# Patient Record
Sex: Female | Born: 1985 | Race: Black or African American | Hispanic: No | Marital: Married | Smoking: Never smoker
Health system: Southern US, Community
[De-identification: ages and names within clinical notes are randomized; demographics above are authoritative.]

## PROBLEM LIST (undated history)

## (undated) ENCOUNTER — Inpatient Hospital Stay (HOSPITAL_COMMUNITY): Payer: Self-pay

## (undated) DIAGNOSIS — F418 Other specified anxiety disorders: Secondary | ICD-10-CM

## (undated) DIAGNOSIS — N390 Urinary tract infection, site not specified: Secondary | ICD-10-CM

## (undated) DIAGNOSIS — F419 Anxiety disorder, unspecified: Secondary | ICD-10-CM

## (undated) DIAGNOSIS — K219 Gastro-esophageal reflux disease without esophagitis: Secondary | ICD-10-CM

## (undated) DIAGNOSIS — O2620 Pregnancy care for patient with recurrent pregnancy loss, unspecified trimester: Secondary | ICD-10-CM

## (undated) DIAGNOSIS — I10 Essential (primary) hypertension: Secondary | ICD-10-CM

## (undated) DIAGNOSIS — A749 Chlamydial infection, unspecified: Secondary | ICD-10-CM

## (undated) HISTORY — DX: Pregnancy care for patient with recurrent pregnancy loss, unspecified trimester: O26.20

## (undated) HISTORY — PX: FOOT SURGERY: SHX648

---

## 2011-10-20 ENCOUNTER — Emergency Department (HOSPITAL_BASED_OUTPATIENT_CLINIC_OR_DEPARTMENT_OTHER)
Admission: EM | Admit: 2011-10-20 | Discharge: 2011-10-20 | Disposition: A | Discharge status: Home / Self Care | Payer: Self-pay | Attending: Emergency Medicine | Admitting: Emergency Medicine

## 2011-10-20 ENCOUNTER — Encounter (HOSPITAL_BASED_OUTPATIENT_CLINIC_OR_DEPARTMENT_OTHER): Payer: Self-pay | Admitting: *Deleted

## 2011-10-20 DIAGNOSIS — N760 Acute vaginitis: Secondary | ICD-10-CM | POA: Insufficient documentation

## 2011-10-20 DIAGNOSIS — A499 Bacterial infection, unspecified: Secondary | ICD-10-CM | POA: Insufficient documentation

## 2011-10-20 DIAGNOSIS — B9689 Other specified bacterial agents as the cause of diseases classified elsewhere: Secondary | ICD-10-CM | POA: Insufficient documentation

## 2011-10-20 DIAGNOSIS — N390 Urinary tract infection, site not specified: Secondary | ICD-10-CM | POA: Insufficient documentation

## 2011-10-20 LAB — URINALYSIS, ROUTINE W REFLEX MICROSCOPIC
Glucose, UA: NEGATIVE mg/dL
Specific Gravity, Urine: 1.028 (ref 1.005–1.030)

## 2011-10-20 LAB — PREGNANCY, URINE: Preg Test, Ur: NEGATIVE

## 2011-10-20 LAB — WET PREP, GENITAL: Trich, Wet Prep: NONE SEEN

## 2011-10-20 LAB — URINE MICROSCOPIC-ADD ON

## 2011-10-20 MED ORDER — METRONIDAZOLE 500 MG PO TABS: 500.0000 mg | ORAL_TABLET | Freq: Two times a day (BID) | ORAL | Status: DC

## 2011-10-20 MED ORDER — NITROFURANTOIN MONOHYD MACRO 100 MG PO CAPS: 100.0000 mg | ORAL_CAPSULE | Freq: Two times a day (BID) | ORAL | Status: AC

## 2011-10-20 MED ORDER — METRONIDAZOLE 500 MG PO TABS: 500.0000 mg | ORAL_TABLET | Freq: Two times a day (BID) | ORAL | Status: AC

## 2011-10-20 NOTE — ED Notes (Signed)
Pt c/o intermittent lower abd pain x2 weeks. Pt sts same is worse today. Pt denies difficulty urinating, odor or discharge.

## 2011-10-20 NOTE — ED Provider Notes (Signed)
History     CSN: [id number]  Arrival date & time 10/20/11  2008   First MD Initiated Contact with Patient 10/20/11 2024      Chief Complaint  Patient presents with  . Abdominal Pain    (Consider location/radiation/quality/duration/timing/severity/associated sxs/prior treatment) HPI Comments: Pt states that she had some dysuria when the symptoms started but that resolved  Patient is a 26 y.o. female presenting with abdominal pain. The history is provided by the patient. No language interpreter was used.  Abdominal Pain The primary symptoms of the illness include abdominal pain and dysuria. The primary symptoms of the illness do not include fever, nausea, vomiting, diarrhea or vaginal discharge. Episode onset: 2 weeks. The onset of the illness was gradual. The problem has not changed since onset.   Past Medical History  Diagnosis Date  . Asthma     Past Surgical History  Procedure Date  . Cesarean section     No family history on file.  History  Substance Use Topics  . Smoking status: Never Smoker   . Smokeless tobacco: Not on file  . Alcohol Use: Yes    OB History    Grav Para Term Preterm Abortions TAB SAB Ect Mult Living                  Review of Systems  Constitutional: Negative for fever.  Respiratory: Negative.   Cardiovascular: Negative.   Gastrointestinal: Positive for abdominal pain. Negative for nausea, vomiting and diarrhea.  Genitourinary: Positive for dysuria. Negative for vaginal discharge.  Neurological: Negative.     Allergies  Review of patient's allergies indicates no known allergies.  Home Medications   Current Outpatient Rx  Name Route Sig Dispense Refill  . IBUPROFEN 200 MG PO TABS Oral Take 200 mg by mouth every 6 (six) hours as needed. Patient used this medication for her headache.      BP 151/78  Pulse 87  Temp(Src) 98.1 F (36.7 C) (Oral)  Resp 18  SpO2 100%  LMP 10/09/2011  Physical Exam  Nursing note and vitals  reviewed. Constitutional: She is oriented to person, place, and time. She appears well-developed.  HENT:  Head: Normocephalic and atraumatic.  Eyes: Conjunctivae and EOM are normal.  Neck: Normal range of motion. Neck supple.  Cardiovascular: Normal rate and regular rhythm.   Pulmonary/Chest: Effort normal and breath sounds normal.  Abdominal: There is tenderness in the suprapubic area. There is no CVA tenderness.  Musculoskeletal: Normal range of motion.  Neurological: She is alert and oriented to person, place, and time.    ED Course  Procedures (including critical care time)  Labs Reviewed  URINALYSIS, ROUTINE W REFLEX MICROSCOPIC - Abnormal; Notable for the following:    APPearance CLOUDY (*)    Hgb urine dipstick LARGE (*)    Protein, ur 100 (*)    Leukocytes, UA MODERATE (*)    All other components within normal limits  URINE MICROSCOPIC-ADD ON - Abnormal; Notable for the following:    Squamous Epithelial / LPF FEW (*)    Bacteria, UA FEW (*)    All other components within normal limits  WET PREP, GENITAL - Abnormal; Notable for the following:    Clue Cells Wet Prep HPF POC MANY (*)    WBC, Wet Prep HPF POC FEW (*)    All other components within normal limits  PREGNANCY, URINE  GC/CHLAMYDIA PROBE AMP, GENITAL   No results found.   1. UTI (lower urinary tract infection)  2. BV (bacterial vaginosis)       MDM  Will treat for simple uti and bv:pt is okay to follow up as needed        Teressa Lower, NP 10/20/11 2136

## 2011-10-20 NOTE — Discharge Instructions (Signed)
Bacterial Vaginosis Bacterial vaginosis (BV) is a vaginal infection where the normal balance of bacteria in the vagina is disrupted. The normal balance is then replaced by an overgrowth of certain bacteria. There are several different kinds of bacteria that can cause BV. BV is the most common vaginal infection in women of childbearing age. CAUSES   The cause of BV is not fully understood. BV develops when there is an increase or imbalance of harmful bacteria.   Some activities or behaviors can upset the normal balance of bacteria in the vagina and put women at increased risk including:   Having a new sex partner or multiple sex partners.   Douching.   Using an intrauterine device (IUD) for contraception.   It is not clear what role sexual activity plays in the development of BV. However, women that have never had sexual intercourse are rarely infected with BV.  Women do not get BV from toilet seats, bedding, swimming pools or from touching objects around them.  SYMPTOMS   Grey vaginal discharge.   A fish-like odor with discharge, especially after sexual intercourse.   Itching or burning of the vagina and vulva.   Burning or pain with urination.   Some women have no signs or symptoms at all.  DIAGNOSIS  Your caregiver must examine the vagina for signs of BV. Your caregiver will perform lab tests and look at the sample of vaginal fluid through a microscope. They will look for bacteria and abnormal cells (clue cells), a pH test higher than 4.5, and a positive amine test all associated with BV.  RISKS AND COMPLICATIONS   Pelvic inflammatory disease (PID).   Infections following gynecology surgery.   Developing HIV.   Developing herpes virus.  TREATMENT  Sometimes BV will clear up without treatment. However, all women with symptoms of BV should be treated to avoid complications, especially if gynecology surgery is planned. Female partners generally do not need to be treated. However,  BV may spread between female sex partners so treatment is helpful in preventing a recurrence of BV.   BV may be treated with antibiotics. The antibiotics come in either pill or vaginal cream forms. Either can be used with nonpregnant or pregnant women, but the recommended dosages differ. These antibiotics are not harmful to the baby.   BV can recur after treatment. If this happens, a second round of antibiotics will often be prescribed.   Treatment is important for pregnant women. If not treated, BV can cause a premature delivery, especially for a pregnant woman who had a premature birth in the past. All pregnant women who have symptoms of BV should be checked and treated.   For chronic reoccurrence of BV, treatment with a type of prescribed gel vaginally twice a week is helpful.  HOME CARE INSTRUCTIONS   Finish all medication as directed by your caregiver.   Do not have sex until treatment is completed.   Tell your sexual partner that you have a vaginal infection. They should see their caregiver and be treated if they have problems, such as a mild rash or itching.   Practice safe sex. Use condoms. Only have 1 sex partner.  PREVENTION  Basic prevention steps can help reduce the risk of upsetting the natural balance of bacteria in the vagina and developing BV:  Do not have sexual intercourse (be abstinent).   Do not douche.   Use all of the medicine prescribed for treatment of BV, even if the signs and symptoms go away.     Tell your sex partner if you have BV. That way, they can be treated, if needed, to prevent reoccurrence.  SEEK MEDICAL CARE IF:   Your symptoms are not improving after 3 days of treatment.   You have increased discharge, pain, or fever.  MAKE SURE YOU:   Understand these instructions.   Will watch your condition.   Will get help right away if you are not doing well or get worse.  FOR MORE INFORMATION  Division of STD Prevention (DSTDP), Centers for Disease  Control and Prevention: www.cdc.gov/std American Social Health Association (ASHA): www.ashastd.org  Document Released: 07/03/2005 Document Revised: 06/22/2011 Document Reviewed: 12/24/2008 ExitCare Patient Information 2012 [employer].Urinary Tract Infection Infections of the urinary tract can start in several places. A bladder infection (cystitis), a kidney infection (pyelonephritis), and a prostate infection (prostatitis) are different types of urinary tract infections (UTIs). They usually get better if treated with medicines (antibiotics) that kill germs. Take all the medicine until it is gone. You or your child may feel better in a few days, but TAKE ALL MEDICINE or the infection may not respond and may become more difficult to treat. HOME CARE INSTRUCTIONS   Drink enough water and fluids to keep the urine clear or pale yellow. Cranberry juice is especially recommended, in addition to large amounts of water.   Avoid caffeine, tea, and carbonated beverages. They tend to irritate the bladder.   Alcohol may irritate the prostate.   Only take over-the-counter or prescription medicines for pain, discomfort, or fever as directed by your caregiver.  To prevent further infections:  Empty the bladder often. Avoid holding urine for long periods of time.   After a bowel movement, women should cleanse from front to back. Use each tissue only once.   Empty the bladder before and after sexual intercourse.  FINDING OUT THE RESULTS OF YOUR TEST Not all test results are available during your visit. If your or your child's test results are not back during the visit, make an appointment with your caregiver to find out the results. Do not assume everything is normal if you have not heard from your caregiver or the medical facility. It is important for you to follow up on all test results. SEEK MEDICAL CARE IF:   There is back pain.   Your baby is older than 3 months with a rectal temperature of 100.5  F (38.1 C) or higher for more than 1 day.   Your or your child's problems (symptoms) are no better in 3 days. Return sooner if you or your child is getting worse.  SEEK IMMEDIATE MEDICAL CARE IF:   There is severe back pain or lower abdominal pain.   You or your child develops chills.   You have a fever.   Your baby is older than 3 months with a rectal temperature of 102 F (38.9 C) or higher.   Your baby is 3 months old or younger with a rectal temperature of 100.4 F (38 C) or higher.   There is nausea or vomiting.   There is continued burning or discomfort with urination.  MAKE SURE YOU:   Understand these instructions.   Will watch your condition.   Will get help right away if you are not doing well or get worse.  Document Released: 04/12/2005 Document Revised: 06/22/2011 Document Reviewed: 11/15/2006 ExitCare Patient Information 2012 [employer]. 

## 2011-10-21 NOTE — ED Provider Notes (Signed)
Medical screening examination/treatment/procedure(s) were performed by non-physician practitioner and as supervising physician I was immediately available for consultation/collaboration.   Glynn Octave, MD 10/21/11 [phone]

## 2013-10-10 ENCOUNTER — Encounter (HOSPITAL_BASED_OUTPATIENT_CLINIC_OR_DEPARTMENT_OTHER): Payer: Self-pay | Admitting: Emergency Medicine

## 2013-10-10 ENCOUNTER — Emergency Department (HOSPITAL_BASED_OUTPATIENT_CLINIC_OR_DEPARTMENT_OTHER)
Admission: EM | Admit: 2013-10-10 | Discharge: 2013-10-10 | Disposition: A | Discharge status: Home / Self Care | Payer: Self-pay | Attending: Emergency Medicine | Admitting: Emergency Medicine

## 2013-10-10 DIAGNOSIS — J45909 Unspecified asthma, uncomplicated: Secondary | ICD-10-CM | POA: Insufficient documentation

## 2013-10-10 DIAGNOSIS — H6690 Otitis media, unspecified, unspecified ear: Secondary | ICD-10-CM | POA: Insufficient documentation

## 2013-10-10 DIAGNOSIS — J029 Acute pharyngitis, unspecified: Secondary | ICD-10-CM | POA: Insufficient documentation

## 2013-10-10 DIAGNOSIS — H6691 Otitis media, unspecified, right ear: Secondary | ICD-10-CM

## 2013-10-10 MED ORDER — IBUPROFEN 800 MG PO TABS: 800.0000 mg | ORAL_TABLET | Freq: Three times a day (TID) | ORAL | Status: DC

## 2013-10-10 MED ORDER — AMOXICILLIN 500 MG PO CAPS: 500.0000 mg | ORAL_CAPSULE | Freq: Three times a day (TID) | ORAL | Status: DC

## 2013-10-10 NOTE — ED Provider Notes (Signed)
CSN: [id number]     Arrival date & time 10/10/13  1827 History   First MD Initiated Contact with Patient 10/10/13 1834     Chief Complaint  Patient presents with  . URI     (Consider location/radiation/quality/duration/timing/severity/associated sxs/prior Treatment) Patient is a 28 y.o. female presenting with URI. The history is provided by the patient.  URI Presenting symptoms: congestion, ear pain, rhinorrhea and sore throat   Presenting symptoms: no cough and no fever   Severity:  Moderate Onset quality:  Gradual Duration:  24 hours Timing:  Constant Progression:  Worsening Chronicity:  New Relieved by:  Nothing Worsened by:  Nothing tried Ineffective treatments: tylenol. Associated symptoms: headaches   Associated symptoms: no neck pain and no wheezing    Lorella NimrodLarhonda Centry is a 28 y.o. female who presents to the ED with nasal congestion and ear pain that started last night.  Past Medical History  Diagnosis Date  . Asthma    Past Surgical History  Procedure Laterality Date  . Cesarean section     No family history on file. History  Substance Use Topics  . Smoking status: Never Smoker   . Smokeless tobacco: Not on file  . Alcohol Use: No   OB History   Grav Para Term Preterm Abortions TAB SAB Ect Mult Living                 Review of Systems  Constitutional: Positive for chills. Negative for fever.  HENT: Positive for congestion, ear pain, rhinorrhea and sore throat.   Eyes: Negative for pain, redness and visual disturbance.  Respiratory: Negative for cough and wheezing.   Cardiovascular: Negative for chest pain and palpitations.  Gastrointestinal: Negative for nausea, vomiting and abdominal pain.  Genitourinary: Negative for dysuria, urgency and frequency.  Musculoskeletal: Negative for neck pain.  Skin: Negative for rash.  Neurological: Positive for headaches. Negative for dizziness and syncope.  Psychiatric/Behavioral: Negative for confusion. The patient  is not nervous/anxious.       Allergies  Review of patient's allergies indicates no known allergies.  Home Medications   Current Outpatient Rx  Name  Route  Sig  Dispense  Refill  . ibuprofen (ADVIL,MOTRIN) 200 MG tablet   Oral   Take 200 mg by mouth every 6 (six) hours as needed. Patient used this medication for her headache.          BP 154/87  Pulse 96  Temp(Src) 99.6 F (37.6 C) (Oral)  Resp 20  Ht 5\' 3"  (1.6 m)  Wt 220 lb (99.791 kg)  BMI 38.98 kg/m2  SpO2 100%  LMP 08/12/2013 Physical Exam  Nursing note and vitals reviewed. Constitutional: She is oriented to person, place, and time. She appears well-developed and well-nourished. No distress.  HENT:  Head: Normocephalic.  Right Ear: Tympanic membrane is erythematous and retracted.  Left Ear: Tympanic membrane is erythematous.  Nose: Mucosal edema and rhinorrhea present.  Mouth/Throat: Uvula is midline and mucous membranes are normal. Posterior oropharyngeal erythema present.  Eyes: EOM are normal.  Neck: Neck supple.  Cardiovascular: Normal rate and regular rhythm.   Pulmonary/Chest: Effort normal. She has no wheezes. She has no rales.  Abdominal: Soft. There is no tenderness.  Musculoskeletal: Normal range of motion.  Lymphadenopathy:    She has cervical adenopathy.  Neurological: She is alert and oriented to person, place, and time. No cranial nerve deficit.  Skin: Skin is warm and dry.  Psychiatric: She has a normal mood and affect. Her behavior  is normal.    ED Course  Procedures  MDM  28 y.o. female with ear pain, sore throat and low grade fever x 24 hours. Will treat otitis with antibiotics and give ibuprofen for pain. She is to follow up with her doctor in one week to be sure the infection is improving. She will return here for worsening symptoms. Stable for discharge without further screening at this time. Discussed with the patient and all questioned fully answered.   Medication List    TAKE  these medications       amoxicillin 500 MG capsule  Commonly known as:  AMOXIL  Take 1 capsule (500 mg total) by mouth 3 (three) times daily.      ASK your doctor about these medications       ibuprofen 200 MG tablet  Commonly known as:  ADVIL,MOTRIN  Take 200 mg by mouth every 6 (six) hours as needed. Patient used this medication for her headache.  Ask about: Which instructions should I use?     ibuprofen 800 MG tablet  Commonly known as:  ADVIL,MOTRIN  Take 1 tablet (800 mg total) by mouth 3 (three) times daily.  Ask about: Which instructions should I use?         [facility] Orlene Och, [place] 10/10/13 Ernestina [place]

## 2013-10-10 NOTE — ED Provider Notes (Signed)
Medical screening examination/treatment/procedure(s) were performed by non-physician practitioner and as supervising physician I was immediately available for consultation/collaboration.     Fancy Dunkley, MD 10/10/13 2318 

## 2013-10-10 NOTE — ED Notes (Signed)
Ear pain, headache, sore throat since last night. She took Tylenol with relief last night.

## 2013-10-10 NOTE — Discharge Instructions (Signed)
Follow up with your doctor in a week to be sure the ear infection resolves. Return here for worsening symptoms.

## 2015-04-15 ENCOUNTER — Encounter (HOSPITAL_BASED_OUTPATIENT_CLINIC_OR_DEPARTMENT_OTHER): Payer: Self-pay | Admitting: *Deleted

## 2015-04-15 ENCOUNTER — Emergency Department (HOSPITAL_BASED_OUTPATIENT_CLINIC_OR_DEPARTMENT_OTHER): Payer: Self-pay

## 2015-04-15 ENCOUNTER — Emergency Department (HOSPITAL_BASED_OUTPATIENT_CLINIC_OR_DEPARTMENT_OTHER)
Admission: EM | Admit: 2015-04-15 | Discharge: 2015-04-15 | Disposition: A | Discharge status: Home / Self Care | Payer: Self-pay | Attending: Emergency Medicine | Admitting: Emergency Medicine

## 2015-04-15 DIAGNOSIS — Z3A01 Less than 8 weeks gestation of pregnancy: Secondary | ICD-10-CM | POA: Insufficient documentation

## 2015-04-15 DIAGNOSIS — O99341 Other mental disorders complicating pregnancy, first trimester: Secondary | ICD-10-CM | POA: Insufficient documentation

## 2015-04-15 DIAGNOSIS — Z79899 Other long term (current) drug therapy: Secondary | ICD-10-CM | POA: Insufficient documentation

## 2015-04-15 DIAGNOSIS — Z792 Long term (current) use of antibiotics: Secondary | ICD-10-CM | POA: Insufficient documentation

## 2015-04-15 DIAGNOSIS — J45909 Unspecified asthma, uncomplicated: Secondary | ICD-10-CM | POA: Insufficient documentation

## 2015-04-15 DIAGNOSIS — F418 Other specified anxiety disorders: Secondary | ICD-10-CM | POA: Insufficient documentation

## 2015-04-15 DIAGNOSIS — O99511 Diseases of the respiratory system complicating pregnancy, first trimester: Secondary | ICD-10-CM | POA: Insufficient documentation

## 2015-04-15 DIAGNOSIS — F1210 Cannabis abuse, uncomplicated: Secondary | ICD-10-CM | POA: Insufficient documentation

## 2015-04-15 DIAGNOSIS — Z791 Long term (current) use of non-steroidal anti-inflammatories (NSAID): Secondary | ICD-10-CM | POA: Insufficient documentation

## 2015-04-15 DIAGNOSIS — O200 Threatened abortion: Secondary | ICD-10-CM | POA: Insufficient documentation

## 2015-04-15 HISTORY — DX: Other specified anxiety disorders: F41.8

## 2015-04-15 LAB — CBC WITH DIFFERENTIAL/PLATELET
BASOS ABS: 0 10*3/uL (ref 0.0–0.1)
Basophils Relative: 1 %
Eosinophils Absolute: 0.2 10*3/uL (ref 0.0–0.7)
Eosinophils Relative: 3 %
HEMATOCRIT: 39.7 % (ref 36.0–46.0)
Hemoglobin: 13.8 g/dL (ref 12.0–15.0)
LYMPHS PCT: 34 %
Lymphs Abs: 1.8 10*3/uL (ref 0.7–4.0)
MCH: 29.8 pg (ref 26.0–34.0)
MCHC: 34.8 g/dL (ref 30.0–36.0)
MCV: 85.7 fL (ref 78.0–100.0)
Monocytes Absolute: 0.4 10*3/uL (ref 0.1–1.0)
Monocytes Relative: 8 %
NEUTROS ABS: 2.9 10*3/uL (ref 1.7–7.7)
NEUTROS PCT: 54 %
PLATELETS: 263 10*3/uL (ref 150–400)
RBC: 4.63 MIL/uL (ref 3.87–5.11)
RDW: 12.3 % (ref 11.5–15.5)
WBC: 5.3 10*3/uL (ref 4.0–10.5)

## 2015-04-15 LAB — BASIC METABOLIC PANEL
BUN: 12 mg/dL (ref 6–20)
CO2: 25 mmol/L (ref 22–32)
Calcium: 8.6 mg/dL — ABNORMAL LOW (ref 8.9–10.3)
Chloride: 110 mmol/L (ref 101–111)
Creatinine, Ser: 0.66 mg/dL (ref 0.44–1.00)
GFR calc Af Amer: >60 mL/min (ref >60)
Glucose, Bld: 97 mg/dL (ref 65–99)
POTASSIUM: 3.9 mmol/L (ref 3.5–5.1)
SODIUM: 137 mmol/L (ref 135–145)

## 2015-04-15 LAB — RAPID URINE DRUG SCREEN, HOSP PERFORMED
Amphetamines: NOT DETECTED
BARBITURATES: NOT DETECTED
Benzodiazepines: NOT DETECTED
COCAINE: NOT DETECTED
Opiates: NOT DETECTED
Tetrahydrocannabinol: POSITIVE — AB

## 2015-04-15 LAB — PREGNANCY, URINE: Preg Test, Ur: POSITIVE — AB

## 2015-04-15 LAB — HCG, QUANTITATIVE, PREGNANCY: hCG, Beta Chain, Quant, S: 139 m[IU]/mL — ABNORMAL HIGH (ref <5)

## 2015-04-15 NOTE — ED Notes (Signed)
Patient transported to Ultrasound 

## 2015-04-15 NOTE — ED Provider Notes (Signed)
CSN: [id number]     Arrival date & time 04/15/15  0900 History   First MD Initiated Contact with Patient 04/15/15 [phone]     Chief Complaint  Patient presents with  . Vaginal Bleeding     (Consider location/radiation/quality/duration/timing/severity/associated sxs/prior Treatment) Patient is a 29 y.o. female presenting with vaginal bleeding. The history is provided by the patient.  Vaginal Bleeding Associated symptoms: abdominal pain   Associated symptoms: no back pain, no dysuria, no fever, no nausea and no vaginal discharge    patient presents with the mild abdominal cramping and vaginal bleeding that is similar to menstrual flow. Patient had a positive pregnancy test earlier this week. Had a negative pregnancy test on September 14. Patient's last menstrual period was August 16 and they've been regular. Patient gravida 3 para 2. Patient started with vaginal bleeding yesterday no passage of clots. No feeling like she'll pass out or lightheadedness. No significant abdominal pain.  Past Medical History  Diagnosis Date  . Asthma   . Depression with anxiety    Past Surgical History  Procedure Laterality Date  . Cesarean section      x 2   No family history on file. Social History  Substance Use Topics  . Smoking status: Never Smoker   . Smokeless tobacco: None  . Alcohol Use: No   OB History    Gravida Para Term Preterm AB TAB SAB Ectopic Multiple Living   1              Review of Systems  Constitutional: Negative for fever.  HENT: Negative for congestion.   Eyes: Negative for visual disturbance.  Respiratory: Negative for shortness of breath.   Cardiovascular: Negative for chest pain.  Gastrointestinal: Positive for abdominal pain. Negative for nausea and vomiting.  Genitourinary: Positive for vaginal bleeding. Negative for dysuria and vaginal discharge.  Musculoskeletal: Negative for back pain.  Neurological: Negative for syncope, light-headedness and headaches.   Hematological: Does not bruise/bleed easily.  Psychiatric/Behavioral: Negative for confusion.      Allergies  Review of patient's allergies indicates no known allergies.  Home Medications   Prior to Admission medications   Medication Sig Start Date End Date Taking? Authorizing Provider  sertraline (ZOLOFT) 100 MG tablet Take 100 mg by mouth daily.   Yes Historical Provider, MD  amoxicillin (AMOXIL) 500 MG capsule Take 1 capsule (500 mg total) by mouth 3 (three) times daily. 10/10/13   Hope Orlene Och, NP  ibuprofen (ADVIL,MOTRIN) 200 MG tablet Take 200 mg by mouth every 6 (six) hours as needed. Patient used this medication for her headache.    Historical Provider, MD  ibuprofen (ADVIL,MOTRIN) 800 MG tablet Take 1 tablet (800 mg total) by mouth 3 (three) times daily. 10/10/13   Hope Orlene Och, NP   BP 122/79 mmHg  Temp(Src) 98.8 F (37.1 C) (Oral)  Resp 18  Ht  (1.575 m)  Wt 203 lb (92.08 kg)  BMI 37.12 kg/m2  SpO2 100%  LMP 03/02/2015 (Exact Date) Physical Exam  Constitutional: She is oriented to person, place, and time. She appears well-developed and well-nourished.  HENT:  Head: Normocephalic and atraumatic.  Mouth/Throat: Oropharynx is clear and moist.  Eyes: Conjunctivae and EOM are normal. Pupils are equal, round, and reactive to light.  Neck: Normal range of motion. Neck supple.  Cardiovascular: Normal rate, regular rhythm and normal heart sounds.   No murmur heard. Pulmonary/Chest: Effort normal and breath sounds normal. No respiratory distress.  Abdominal: Soft. Bowel sounds  are normal. There is no tenderness.  Musculoskeletal: Normal range of motion.  Neurological: She is alert and oriented to person, place, and time. No cranial nerve deficit. She exhibits normal muscle tone. Coordination normal.  Skin: Skin is warm. No rash noted.  Nursing note and vitals reviewed.   ED Course  Procedures (including critical care time) Labs Review Labs Reviewed  PREGNANCY,  URINE - Abnormal; Notable for the following:    Preg Test, Ur POSITIVE (*)    All other components within normal limits  URINE RAPID DRUG SCREEN, HOSP PERFORMED - Abnormal; Notable for the following:    Tetrahydrocannabinol POSITIVE (*)    All other components within normal limits  BASIC METABOLIC PANEL - Abnormal; Notable for the following:    Calcium 8.6 (*)    Anion gap <3 (*)    All other components within normal limits  HCG, QUANTITATIVE, PREGNANCY - Abnormal; Notable for the following:    hCG, Beta Chain, Quant, S 139 (*)    All other components within normal limits  CBC WITH DIFFERENTIAL/PLATELET    Imaging Review US Ob Comp Less 14 Wks  04/15/2015   CLINICAL DATA:  C/o vaginal bleeding and pelvic cramping that started yesterday and got heavier yesterday evening; hx 2 prior c-sections, LEEP  EXAM: OBSTETRIC <14 WK [place] AND TRANSVAGINAL OB US  TECHNIQUE: Both transabdominal and transvaginal ultrasound examinations were performed for complete evaluation of the gestation as well as the maternal uterus, adnexal regions, and pelvic cul-de-sac. Transvaginal technique was performed to assess early pregnancy.  COMPARISON:  None.  FINDINGS: Intrauterine gestational sac: None.  Maternal uterus/adnexae: The RIGHT ovary appears normal. No uterine mass. Myometrial echotexture is normal.  LEFT ovary demonstrates a 14 mm x 10 mm x 9 mm heterogeneous structure within the central ovary, most compatible with corpus luteum cyst.  Trace free fluid is present.  IMPRESSION: No intrauterine pregnancy identified in this patient with a quantitative beta HCG of 139. Probable corpus luteum cyst in the LEFT ovary.   Electronically Signed   By: Andreas Newport M.D.   On: 04/15/2015 11:07   US Ob Transvaginal  04/15/2015   CLINICAL DATA:  C/o vaginal bleeding and pelvic cramping that started yesterday and got heavier yesterday evening; hx 2 prior c-sections, LEEP  EXAM: OBSTETRIC <14 WK [place] AND TRANSVAGINAL OB US   TECHNIQUE: Both transabdominal and transvaginal ultrasound examinations were performed for complete evaluation of the gestation as well as the maternal uterus, adnexal regions, and pelvic cul-de-sac. Transvaginal technique was performed to assess early pregnancy.  COMPARISON:  None.  FINDINGS: Intrauterine gestational sac: None.  Maternal uterus/adnexae: The RIGHT ovary appears normal. No uterine mass. Myometrial echotexture is normal.  LEFT ovary demonstrates a 14 mm x 10 mm x 9 mm heterogeneous structure within the central ovary, most compatible with corpus luteum cyst.  Trace free fluid is present.  IMPRESSION: No intrauterine pregnancy identified in this patient with a quantitative beta HCG of 139. Probable corpus luteum cyst in the LEFT ovary.   Electronically Signed   By: Andreas Newport M.D.   On: 04/15/2015 11:07   I have personally reviewed and evaluated these images and lab results as part of my medical decision-making.   EKG Interpretation None      MDM   Final diagnoses:  Miscarriage, threatened, early pregnancy    Patient with a positive home pregnancy test earlier this week. Negative home pregnancy test on September 14. Last menstrual period which is been regular  was August 16. Most likely patient is ongoing miscarriage with a low hCG. Patient without any significant pain or abdominal tenderness.    cannot completely rule out the possibility of an ectopic. But unlikely with such a low quantitative hCG.  Patient will follow-up with her a clinic in [facility] for repeat quantitative hCG. Patient will return for any lightheadedness feeling like you pass out or worse abdominal pain. Currently is just like menstrual cramps. No significant heavy bleeding. Hemoglobin and hematocrits normal.  Patient given work note and precautions.   Vanetta Mulders, MD 04/15/15 1131

## 2015-04-15 NOTE — ED Notes (Signed)
LMP 03/02/15.  Positive home pregnancy test.  Started to have abdominal cramps and bleeding yesterday.

## 2015-04-15 NOTE — Discharge Instructions (Signed)
Threatened Miscarriage A threatened miscarriage is when you have vaginal bleeding during your first 20 weeks of pregnancy but the pregnancy has not ended. Your doctor will do tests to make sure you are still pregnant. The cause of the bleeding may not be known. This condition does not mean your pregnancy will end. It does increase the risk of it ending (complete miscarriage). HOME CARE   Make sure you keep all your doctor visits for prenatal care.  Get plenty of rest.  Do not have sex or use tampons if you have vaginal bleeding.  Do not douche.  Do not smoke or use drugs.  Do not drink alcohol.  Avoid caffeine. GET HELP IF:  You have light bleeding from your vagina.  You have belly pain or cramping.  You have a fever. GET HELP RIGHT AWAY IF:   You have heavy bleeding from your vagina.  You have clots of blood coming from your vagina.  You have bad pain or cramps in your low back or belly.  You have fever, chills, and bad belly pain. MAKE SURE YOU:   Understand these instructions.  Will watch your condition.  Will get help right away if you are not doing well or get worse. Document Released: 06/15/2008 Document Revised: 07/08/2013 Document Reviewed: 04/29/2013 [facility] Patient Information 2015 [place], [place]. This information is not intended to replace advice given to you by your health care provider. Make sure you discuss any questions you have with your health care provider.  Follow-up with the GYN for repeat pregnancy hormone number in the next 2-4 days would be important. If the numbers going down this is a miscarriage. If it's going up and the pregnancy is developing and is probably very early pregnancy. Return for increased pain or feeling like you pass out.

## 2015-04-15 NOTE — ED Notes (Signed)
MD at bedside. 

## 2015-04-19 ENCOUNTER — Encounter (HOSPITAL_COMMUNITY): Payer: Self-pay | Admitting: *Deleted

## 2015-04-19 ENCOUNTER — Inpatient Hospital Stay (HOSPITAL_COMMUNITY)
Admission: AD | Admit: 2015-04-19 | Discharge: 2015-04-19 | Disposition: A | Discharge status: Home / Self Care | Payer: Self-pay | Source: Ambulatory Visit | Attending: Family Medicine | Admitting: Family Medicine

## 2015-04-19 DIAGNOSIS — Z3A01 Less than 8 weeks gestation of pregnancy: Secondary | ICD-10-CM | POA: Insufficient documentation

## 2015-04-19 DIAGNOSIS — O26851 Spotting complicating pregnancy, first trimester: Secondary | ICD-10-CM | POA: Insufficient documentation

## 2015-04-19 DIAGNOSIS — O3680X0 Pregnancy with inconclusive fetal viability, not applicable or unspecified: Secondary | ICD-10-CM

## 2015-04-19 LAB — HCG, QUANTITATIVE, PREGNANCY: HCG, BETA CHAIN, QUANT, S: 446 m[IU]/mL — AB (ref <5)

## 2015-04-19 NOTE — Discharge Instructions (Signed)

## 2015-04-19 NOTE — MAU Provider Note (Signed)
S: 29 y.o. G1P0  by LMP presents to MAU for repeat hcg.  She presented to [facility] on 9/26 with spotting in early pregnancy.  She denies abdominal pain or vaginal bleeding today.    Her quant hcg on 9/29 was 139 and ultrasound showed no IUP, with no gestational sac or yolk sac identified.  Also, no ectopic pregnancy was visualized on [place].   O: BP 147/77 mmHg  Pulse 75  Temp(Src) 98.1 F (36.7 C) (Oral)  Resp 18  Ht  (1.575 m)  Wt 92.08 kg (203 lb)  BMI 37.12 kg/m2  LMP 03/02/2015 (Exact Date)  Physical Examination: General appearance - alert, well appearing, and in no distress, oriented to person, place, and time and acyanotic, in no respiratory distress  Results for orders placed or performed during the hospital encounter of 04/19/15 (from the past 24 hour(s))  hCG, quantitative, pregnancy     Status: Abnormal   Collection Time: 04/19/15 12:00 PM  Result Value Ref Range   hCG, Beta Chain, Quant, S 446 (H) <5 mIU/mL       A: 1. Pregnancy of unknown anatomic location     P: D/C home with ectopic/bleeding precautions F/U with repeat hcg in 48 hours Return to MAU as needed for emergencies  LEFTWICH-KIRBY, Shabre Kreher, CNM 2:18 PM

## 2015-04-19 NOTE — MAU Note (Signed)
Was seen at [facility] office with vaginal bleeding. + UPT. Had labs. Was advised to F/U in MAU for BHCG level. States bleeding stopped today. Denies pain.

## 2015-04-21 ENCOUNTER — Inpatient Hospital Stay (HOSPITAL_COMMUNITY)
Admission: AD | Admit: 2015-04-21 | Discharge: 2015-04-21 | Disposition: A | Discharge status: Home / Self Care | Payer: Self-pay | Source: Ambulatory Visit | Attending: Obstetrics and Gynecology | Admitting: Obstetrics and Gynecology

## 2015-04-21 DIAGNOSIS — O039 Complete or unspecified spontaneous abortion without complication: Secondary | ICD-10-CM | POA: Insufficient documentation

## 2015-04-21 LAB — HCG, QUANTITATIVE, PREGNANCY: HCG, BETA CHAIN, QUANT, S: 90 m[IU]/mL — AB (ref <5)

## 2015-04-21 LAB — ABO/RH: ABO/RH(D): B POS

## 2015-04-21 NOTE — Discharge Instructions (Signed)
Miscarriage  A miscarriage is the sudden loss of an unborn baby (fetus) before the 20th week of pregnancy. Most miscarriages happen in the first 3 months of pregnancy. Sometimes, it happens before a woman even knows she is pregnant. A miscarriage is also called a "spontaneous miscarriage" or "early pregnancy loss." Having a miscarriage can be an emotional experience. Talk with your caregiver about any questions you may have about miscarrying, the grieving process, and your future pregnancy plans.  CAUSES    Problems with the fetal chromosomes that make it impossible for the baby to develop normally. Problems with the baby's genes or chromosomes are most often the result of errors that occur, by chance, as the embryo divides and grows. The problems are not inherited from the parents.   Infection of the cervix or uterus.    Hormone problems.    Problems with the cervix, such as having an incompetent cervix. This is when the tissue in the cervix is not strong enough to hold the pregnancy.    Problems with the uterus, such as an abnormally shaped uterus, uterine fibroids, or congenital abnormalities.    Certain medical conditions.    Smoking, drinking alcohol, or taking illegal drugs.    Trauma.   Often, the cause of a miscarriage is unknown.   SYMPTOMS    Vaginal bleeding or spotting, with or without cramps or pain.   Pain or cramping in the abdomen or lower back.   Passing fluid, tissue, or blood clots from the vagina.  DIAGNOSIS   Your caregiver will perform a physical exam. You may also have an ultrasound to confirm the miscarriage. Blood or urine tests may also be ordered.  TREATMENT    Sometimes, treatment is not necessary if you naturally pass all the fetal tissue that was in the uterus. If some of the fetus or placenta remains in the body (incomplete miscarriage), tissue left behind may become infected and must be removed. Usually, a dilation and curettage (D and C) procedure is performed.  During a D and C procedure, the cervix is widened (dilated) and any remaining fetal or placental tissue is gently removed from the uterus.   Antibiotic medicines are prescribed if there is an infection. Other medicines may be given to reduce the size of the uterus (contract) if there is a lot of bleeding.   If you have Rh negative blood and your baby was Rh positive, you will need a Rh immunoglobulin shot. This shot will protect any future baby from having Rh blood problems in future pregnancies.  HOME CARE INSTRUCTIONS    Your caregiver may order bed rest or may allow you to continue light activity. Resume activity as directed by your caregiver.   Have someone help with home and family responsibilities during this time.    Keep track of the number of sanitary pads you use each day and how soaked (saturated) they are. Write down this information.    Do not use tampons. Do not douche or have sexual intercourse until approved by your caregiver.    Only take over-the-counter or prescription medicines for pain or discomfort as directed by your caregiver.    Do not take aspirin. Aspirin can cause bleeding.    Keep all follow-up appointments with your caregiver.    If you or your partner have problems with grieving, talk to your caregiver or seek counseling to help cope with the pregnancy loss. Allow enough time to grieve before trying to get pregnant again.     SEEK IMMEDIATE MEDICAL CARE IF:    You have severe cramps or pain in your back or abdomen.   You have a fever.   You pass large blood clots (walnut-sized or larger) ortissue from your vagina. Save any tissue for your caregiver to inspect.    Your bleeding increases.    You have a thick, bad-smelling vaginal discharge.   You become lightheaded, weak, or you faint.    You have chills.   MAKE SURE YOU:   Understand these instructions.   Will watch your condition.   Will get help right away if you are not doing well or get worse.     This  information is not intended to replace advice given to you by your health care provider. Make sure you discuss any questions you have with your health care provider.     Document Released: 12/27/2000 Document Revised: 10/28/2012 Document Reviewed: 08/22/2011  Elsevier Interactive Patient Education 2016 [employer]

## 2015-04-21 NOTE — MAU Provider Note (Signed)
S:  [name] is a 29 y.o. female G1P0 at [redacted]w[redacted]d presenting to MAU for a follow up beta hcg level. She has been Carroll in MAU and another facility for vaginal bleeding. Currently she has light vaginal spotting, however no pain.    O:  GENERAL: Well-developed, well-nourished female in no acute distress.  LUNGS: Effort normal SKIN: Warm, dry and without erythema PSYCH: Normal mood and affect  Today's Vitals   04/21/15 1349 04/21/15 1435  BP: 170/72 143/77  Pulse: 85 84  Temp: 98.2 F (36.8 C) 98 F (36.7 C)  TempSrc:  Oral  Resp: 16 17   MDM:  Beta hcg 9/29: 139 Beta hcg 10/3: 446 Beta hcg 10/5: 90  B positive blood type   A:  1. SAB (spontaneous abortion)    P:  Discharge home in stable condition Follow up in 1 week in the WOC; the WOC to call to schedule Bleeding precautions  Return to MAU if symptoms worsen    Duane Lope, NP 04/21/2015 2:16 PM

## 2015-04-21 NOTE — MAU Note (Signed)
Pt presents to MAU for repeat labs , BHCG. Reports spotting on and off, denies any pain

## 2015-04-30 ENCOUNTER — Inpatient Hospital Stay (HOSPITAL_COMMUNITY)
Admission: AD | Admit: 2015-04-30 | Discharge: 2015-04-30 | Discharge status: Absent without leave | Payer: Self-pay | Source: Ambulatory Visit | Attending: Obstetrics & Gynecology | Admitting: Obstetrics & Gynecology

## 2015-04-30 ENCOUNTER — Other Ambulatory Visit: Payer: Self-pay

## 2015-04-30 DIAGNOSIS — O4690 Antepartum hemorrhage, unspecified, unspecified trimester: Secondary | ICD-10-CM

## 2015-05-01 LAB — HCG, QUANTITATIVE, PREGNANCY

## 2015-05-07 ENCOUNTER — Telehealth: Payer: Self-pay | Admitting: *Deleted

## 2015-05-07 NOTE — Telephone Encounter (Signed)
Called [facility] per Dr. Debroah LoopArnold and notified bhcg was <2 (normal ) and doesn't need any further lab draws. Also advised her to use condoms to prevent pregnancy for a few months, then may resume intercourse with or without birth control as she desires.

## 2016-02-22 ENCOUNTER — Encounter (HOSPITAL_COMMUNITY): Payer: Self-pay

## 2016-03-10 ENCOUNTER — Encounter (HOSPITAL_BASED_OUTPATIENT_CLINIC_OR_DEPARTMENT_OTHER): Payer: Self-pay | Admitting: *Deleted

## 2016-03-10 ENCOUNTER — Emergency Department (HOSPITAL_BASED_OUTPATIENT_CLINIC_OR_DEPARTMENT_OTHER): Payer: Medicaid Other

## 2016-03-10 ENCOUNTER — Emergency Department (HOSPITAL_BASED_OUTPATIENT_CLINIC_OR_DEPARTMENT_OTHER)
Admission: EM | Admit: 2016-03-10 | Discharge: 2016-03-10 | Disposition: A | Discharge status: Home / Self Care | Payer: Medicaid Other | Attending: Emergency Medicine | Admitting: Emergency Medicine

## 2016-03-10 DIAGNOSIS — J45909 Unspecified asthma, uncomplicated: Secondary | ICD-10-CM | POA: Diagnosis not present

## 2016-03-10 DIAGNOSIS — M79672 Pain in left foot: Secondary | ICD-10-CM | POA: Insufficient documentation

## 2016-03-10 MED ORDER — IBUPROFEN 800 MG PO TABS
800.0000 mg | ORAL_TABLET | Freq: Once | ORAL | Status: AC
  Administered 2016-03-10: 800 mg via ORAL
  Filled 2016-03-10: qty 1

## 2016-03-10 MED ORDER — IBUPROFEN 800 MG PO TABS: 800.0000 mg | ORAL_TABLET | Freq: Three times a day (TID) | ORAL | 0 refills | Status: DC | PRN

## 2016-03-10 NOTE — ED Notes (Signed)
Pt to xray via stretcher, alert, NAD, calm.  

## 2016-03-10 NOTE — ED Notes (Signed)
Dr. Molpus into room, at BS. 

## 2016-03-10 NOTE — ED Provider Notes (Signed)
[facility] Provider Note   CSN: [id number] Arrival date & time: 03/10/16  0533     History   Chief Complaint Chief Complaint  Patient presents with  . Foot Pain    HPI Sheila Carroll is a 30 y.o. female with a history of chronic pain in her feet. She is here with pain that has acutely worsened in her left foot. The pain is located dorsally near the bases of the fourth and fifth metatarsals. The pain has both a dull and sharp component. It is worse with palpation or ambulation. She denies acute trauma. She's been taking ibuprofen acceptable relief but was to make sure there is nothing seriously wrong with her foot.. There is no associated swelling, deformity or erythema. She has an appointment with a podiatrist scheduled for October.  HPI  Past Medical History:  Diagnosis Date  . Asthma   . Depression with anxiety     There are no active problems to display for this patient.   Past Surgical History:  Procedure Laterality Date  . CESAREAN SECTION     x 2    OB History    Gravida Para Term Preterm AB Living   1             SAB TAB Ectopic Multiple Live Births                   Home Medications    Prior to Admission medications   Not on File    Family History History reviewed. No pertinent family history.  Social History Social History  Substance Use Topics  . Smoking status: Never Smoker  . Smokeless tobacco: Never Used  . Alcohol use Yes     Comment: socially      Allergies   Review of patient's allergies indicates no known allergies.   Review of Systems Review of Systems  All other systems reviewed and are negative.   Physical Exam Updated Vital Signs BP 157/91 (BP Location: Right Arm)   Pulse 77   Temp 98.3 F (36.8 C) (Oral)   Resp 18   Ht 5\' 2"  (1.575 m)   Wt 210 lb (95.3 kg)   LMP 03/03/2016 (Exact Date)   SpO2 100%   Breastfeeding? No   BMI 38.41 kg/m   Physical Exam General: Well-developed, well-nourished  female in no acute distress; appearance consistent with age of record HENT: normocephalic; atraumatic Eyes: Normal appearance Neck: supple Heart: regular rate and rhythm Lungs: clear to auscultation bilaterally Abdomen: soft; nondistended Extremities: No acute deformity; bunions bilaterally; tenderness over the dorsal left foot near the bases of the fourth and fifth metatarsals without associated erythema, warmth or swelling; pulses normal Neurologic: Awake, alert and oriented; motor function intact in all extremities and symmetric; no facial droop Skin: Warm and dry Psychiatric: Normal mood and affect    ED Treatments / Results  Nursing notes and vitals signs, including pulse oximetry, reviewed.  Summary of this visit's results, reviewed by myself:  Imaging Studies: Dg Foot Complete Left  Result Date: 03/10/2016 CLINICAL DATA:  Left foot pain for 12 hours.  No known injury. EXAM: LEFT FOOT - COMPLETE 3+ VIEW COMPARISON:  None. FINDINGS: There is no evidence of fracture or dislocation. Hallux valgus. Hallux alignment relative to the first metatarsal mildly increased at 23 degrees. Inter metatarsal angle of 12 degrees. There is no evidence of arthropathy or other focal bone abnormality. Small plantar calcaneal spur and Achilles tendon enthesophyte. Soft tissues are unremarkable.  IMPRESSION: Mild hallux valgus.  No acute osseous abnormality. Electronically Signed   By: Rubye OaksMelanie  Ehinger M.D.   On: 03/10/2016 06:14     Procedures (including critical care time)   Final Clinical Impressions(s) / ED Diagnoses   Final diagnoses:  Foot pain, left      Paula LibraJohn Reylene Stauder, MD 03/10/16 [phone]

## 2016-03-10 NOTE — ED Triage Notes (Signed)
Pt with left foot pain x 12 hours. Denies numbness or tingling.pulses present

## 2016-06-23 ENCOUNTER — Emergency Department (HOSPITAL_BASED_OUTPATIENT_CLINIC_OR_DEPARTMENT_OTHER): Payer: Medicaid Other

## 2016-06-23 ENCOUNTER — Emergency Department (HOSPITAL_BASED_OUTPATIENT_CLINIC_OR_DEPARTMENT_OTHER)
Admission: EM | Admit: 2016-06-23 | Discharge: 2016-06-24 | Disposition: A | Discharge status: Home / Self Care | Payer: Medicaid Other | Attending: Emergency Medicine | Admitting: Emergency Medicine

## 2016-06-23 ENCOUNTER — Encounter (HOSPITAL_BASED_OUTPATIENT_CLINIC_OR_DEPARTMENT_OTHER): Payer: Self-pay

## 2016-06-23 DIAGNOSIS — J45909 Unspecified asthma, uncomplicated: Secondary | ICD-10-CM | POA: Diagnosis not present

## 2016-06-23 DIAGNOSIS — Z3A01 Less than 8 weeks gestation of pregnancy: Secondary | ICD-10-CM | POA: Diagnosis not present

## 2016-06-23 DIAGNOSIS — R110 Nausea: Secondary | ICD-10-CM | POA: Diagnosis not present

## 2016-06-23 DIAGNOSIS — O209 Hemorrhage in early pregnancy, unspecified: Secondary | ICD-10-CM | POA: Insufficient documentation

## 2016-06-23 DIAGNOSIS — R102 Pelvic and perineal pain: Secondary | ICD-10-CM | POA: Diagnosis present

## 2016-06-23 DIAGNOSIS — R1031 Right lower quadrant pain: Secondary | ICD-10-CM

## 2016-06-23 LAB — COMPREHENSIVE METABOLIC PANEL
ALBUMIN: 4 g/dL (ref 3.5–5.0)
ALK PHOS: 68 U/L (ref 38–126)
ALT: 24 U/L (ref 14–54)
ANION GAP: 7 (ref 5–15)
AST: 25 U/L (ref 15–41)
BILIRUBIN TOTAL: 0.4 mg/dL (ref 0.3–1.2)
BUN: 15 mg/dL (ref 6–20)
CALCIUM: 9.2 mg/dL (ref 8.9–10.3)
CO2: 24 mmol/L (ref 22–32)
CREATININE: 0.72 mg/dL (ref 0.44–1.00)
Chloride: 107 mmol/L (ref 101–111)
GFR calc Af Amer: >60 mL/min (ref >60)
GFR calc non Af Amer: >60 mL/min (ref >60)
GLUCOSE: 157 mg/dL — AB (ref 65–99)
Potassium: 3.5 mmol/L (ref 3.5–5.1)
Sodium: 138 mmol/L (ref 135–145)
TOTAL PROTEIN: 7.3 g/dL (ref 6.5–8.1)

## 2016-06-23 LAB — CBC WITH DIFFERENTIAL/PLATELET
BASOS PCT: 0 %
Basophils Absolute: 0 10*3/uL (ref 0.0–0.1)
EOS ABS: 0.2 10*3/uL (ref 0.0–0.7)
Eosinophils Relative: 2 %
HEMATOCRIT: 35.1 % — AB (ref 36.0–46.0)
HEMOGLOBIN: 12.1 g/dL (ref 12.0–15.0)
Lymphocytes Relative: 29 %
Lymphs Abs: 3.1 10*3/uL (ref 0.7–4.0)
MCH: 29.7 pg (ref 26.0–34.0)
MCHC: 34.5 g/dL (ref 30.0–36.0)
MCV: 86 fL (ref 78.0–100.0)
MONOS PCT: 7 %
Monocytes Absolute: 0.7 10*3/uL (ref 0.1–1.0)
NEUTROS ABS: 6.8 10*3/uL (ref 1.7–7.7)
NEUTROS PCT: 62 %
Platelets: 254 10*3/uL (ref 150–400)
RBC: 4.08 MIL/uL (ref 3.87–5.11)
RDW: 12.8 % (ref 11.5–15.5)
WBC: 10.9 10*3/uL — AB (ref 4.0–10.5)

## 2016-06-23 LAB — PREGNANCY, URINE: PREG TEST UR: POSITIVE — AB

## 2016-06-23 LAB — HCG, QUANTITATIVE, PREGNANCY: hCG, Beta Chain, Quant, S: 639 m[IU]/mL — ABNORMAL HIGH (ref <5)

## 2016-06-23 NOTE — ED Triage Notes (Signed)
C/o abd pain/menstrual cramps x today-LMP 12/6-NAD-steady gait

## 2016-06-23 NOTE — ED Provider Notes (Signed)
[facility] Note   CSN: [id number] Arrival date & time: 06/23/16  2201     History   Chief Complaint Chief Complaint  Patient presents with  . Abdominal Pain    HPI Sheila Carroll is a [id number] 30 y.o. female presenting with right lower quadrant throbbing pain radiating to her vagina since this morning. She was walking to the store when the pain started and it has been getting progressively worse since. She states that "it feels like contractions". She reports irregular menstrual cycles and states that she is currently on her cycle but it is much lighter than usual. She did not soak through two thin pads and today the blood is dark brown. Last normal period was October 31st. She has tried aspirin for the pain today wild mild relief. She also endorses nausea since last night and this morning, but has been eating and drinking well.The pain is worse with sitting and walking. She denies fever, chills, vomiting, diarrhea, lightheadedness, dizziness, headache, C/P, SOB or any other symptoms.  HPI  Past Medical History:  Diagnosis Date  . Asthma   . Depression with anxiety     There are no active problems to display for this patient.   Past Surgical History:  Procedure Laterality Date  . CESAREAN SECTION     x 2    OB History    Gravida Para Term Preterm AB Living   2             SAB TAB Ectopic Multiple Live Births                   Home Medications    Prior to Admission medications   Not on File    Family History No family history on file.  Social History Social History  Substance Use Topics  . Smoking status: Never Smoker  . Smokeless tobacco: Never Used  . Alcohol use Yes     Comment: occ     Allergies   Patient has no known allergies.   Review of Systems Review of Systems  Constitutional: Negative for appetite change, chills, diaphoresis, fatigue and fever.  HENT: Negative for congestion and sore throat.   Eyes: Negative for  pain and visual disturbance.  Respiratory: Negative for cough and shortness of breath.   Cardiovascular: Negative for chest pain and palpitations.  Gastrointestinal: Positive for abdominal pain and nausea. Negative for abdominal distention, anal bleeding, blood in stool, diarrhea, rectal pain and vomiting.  Genitourinary: Positive for flank pain, menstrual problem, pelvic pain, vaginal bleeding and vaginal pain. Negative for decreased urine volume, difficulty urinating, dyspareunia, dysuria, frequency, hematuria, urgency and vaginal discharge.  Musculoskeletal: Negative for arthralgias, back pain, gait problem, neck pain and neck stiffness.  Skin: Negative for color change, pallor, rash and wound.  Neurological: Negative for dizziness, syncope, weakness, light-headedness, numbness and headaches.  All other systems reviewed and are negative.    Physical Exam Updated Vital Signs BP (!) 146/51 (BP Location: Left Arm)   Pulse 88   Temp 98.5 F (36.9 C) (Oral)   Resp 20   Ht 5\' 3"  (1.6 m)   Wt 99.3 kg   LMP 06/21/2016   SpO2 100%   BMI 38.79 kg/m   Physical Exam  Constitutional: She is oriented to person, place, and time. She appears well-developed and well-nourished. No distress.  Patient is non-toxic appearing, lying in bed, mild discomfort, no acute distress  HENT:  Head: Normocephalic and atraumatic.  Mouth/Throat: No  oropharyngeal exudate.  Eyes: Conjunctivae and EOM are normal. Pupils are equal, round, and reactive to light. Right eye exhibits no discharge. Left eye exhibits no discharge.  Neck: Normal range of motion. Neck supple.  Cardiovascular: Normal rate, regular rhythm and normal heart sounds.   No murmur heard. Pulmonary/Chest: Effort normal and breath sounds normal. No stridor. No respiratory distress. She has no wheezes. She has no rales. She exhibits no tenderness.  Abdominal: Soft. Bowel sounds are normal. She exhibits no distension and no mass. There is tenderness.  There is no guarding.  Patient has diffuse tenderness to palpation and experiences tenderness on the right side every time.  Genitourinary: Vagina normal. No vaginal discharge found.  Genitourinary Comments: Cervical os closed. Small amount of old blood in the vaginal vault and from cervical os. Tenderness on pelvic and bimanual exam.  Musculoskeletal: Normal range of motion. She exhibits no edema, tenderness or deformity.  Neurological: She is alert and oriented to person, place, and time. No sensory deficit.  Skin: Skin is warm and dry. No rash noted. She is not diaphoretic. No erythema. No pallor.  Psychiatric: She has a normal mood and affect. Her behavior is normal.  Nursing note and vitals reviewed.    ED Treatments / Results  Labs (all labs ordered are listed, but only abnormal results are displayed) Labs Reviewed  WET PREP, GENITAL - Abnormal; Notable for the following:       Result Value   WBC, Wet Prep HPF POC FEW (*)    All other components within normal limits  PREGNANCY, URINE - Abnormal; Notable for the following:    Preg Test, Ur POSITIVE (*)    All other components within normal limits  HCG, QUANTITATIVE, PREGNANCY - Abnormal; Notable for the following:    hCG, Beta Chain, Quant, S 639 (*)    All other components within normal limits  CBC WITH DIFFERENTIAL/PLATELET - Abnormal; Notable for the following:    WBC 10.9 (*)    HCT 35.1 (*)    All other components within normal limits  COMPREHENSIVE METABOLIC PANEL - Abnormal; Notable for the following:    Glucose, Bld 157 (*)    All other components within normal limits  RPR  HIV ANTIBODY (ROUTINE TESTING)  GC/CHLAMYDIA PROBE AMP ([place]) NOT AT [facility]    EKG  EKG Interpretation None       Radiology [place] Ob Comp < 14 Wks  Result Date: 06/23/2016 CLINICAL DATA:  To diffuse midline pelvic cramping x1 day with vaginal spotting x2 days. 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 Beta HCG is pending. EXAM:  OBSTETRIC <14 WK US AND TRANSVAGINAL OB US TECHNIQUE: Both transabdominal and transvaginal ultrasound examinations were performed for complete evaluation of the gestation as well as the maternal uterus, adnexal regions, and pelvic cul-de-sac. Transvaginal technique was performed to assess early pregnancy. COMPARISON:  None for this pregnancy FINDINGS: Intrauterine gestational sac: None Yolk sac:  Not Visualized. Embryo:  Not Visualized. Cardiac Activity: Not Visualized. Heart Rate: Not applicable Subchorionic hemorrhage:  Not applicable Maternal uterus/adnexae: Corpus luteum noted of the left ovary measuring 1.7 x 1.4 x 1.6 cm. No ectopic pregnancy is identified. The right ovary is normal. Endometrial stripe is 17 mm thickness. No uterine mass is identified. Small amount of free fluid is seen about both ovaries. IMPRESSION: No intrauterine or ectopic pregnancy is identified. Electronically Signed   By: Tollie Ethavid  Kwon M.D.   On: 06/23/2016 23:39   [place] Ob Transvaginal  Result Date: 06/23/2016 CLINICAL DATA:  To diffuse midline pelvic cramping x1 day with vaginal spotting x2 days. 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 Beta HCG is pending. EXAM: OBSTETRIC <14 WK US AND TRANSVAGINAL OB US TECHNIQUE: Both transabdominal and transvaginal ultrasound examinations were performed for complete evaluation of the gestation as well as the maternal uterus, adnexal regions, and pelvic cul-de-sac. Transvaginal technique was performed to assess early pregnancy. COMPARISON:  None for this pregnancy FINDINGS: Intrauterine gestational sac: None Yolk sac:  Not Visualized. Embryo:  Not Visualized. Cardiac Activity: Not Visualized. Heart Rate: Not applicable Subchorionic hemorrhage:  Not applicable Maternal uterus/adnexae: Corpus luteum noted of the left ovary measuring 1.7 x 1.4 x 1.6 cm. No ectopic pregnancy is identified. The right ovary is normal. Endometrial stripe is 17 mm thickness. No uterine mass is identified. Small amount  of free fluid is seen about both ovaries. IMPRESSION: No intrauterine or ectopic pregnancy is identified. Electronically Signed   By: Tollie Ethavid  Kwon M.D.   On: 06/23/2016 23:39    Procedures Procedures (including critical care time)  Medications Ordered in ED Medications  acetaminophen (TYLENOL) tablet 650 mg (650 mg Oral Given 06/24/16 0030)     Initial Impression / Assessment and Plan / ED Course  I have reviewed the triage vital signs and the nursing notes.  Pertinent labs & imaging results that were available during my care of the patient were reviewed by me and considered in my medical decision making (see chart for details).  Clinical Course    [phone] 30 y/o female presenting with vaginal bleeding and right lower quadrant pain radiating to her vagina x 1 day. Progressive onset of worsening pain. Positive urine pregnancy test. Patient states that the bleeding began yesterday and was lighter than usual. Today she notes only dark brown blood. Last full period October 31st which would put her at less than 6wks today. Denies fever, chills, vomiting, diarrhea, she has been eating and drinking without any change in appetite.  Transvaginal US did not show an IUP or ectopic. Left corpus luteum visualized. Cannot rule out ectopic and patient needs to be seen in 48hrs for repeat quantitative HcG and follow up. Discharge home with close follow up with OB/GYN and symptomatic relief. She is hemodynamically stable, afebrile, non-toxic appearing and in no distress. Symptoms were well managed while in the ED and she is ready to go home.  Discussed strict return precautions. Patient was advised to return to the emergency department if experiencing any worsening of symptoms, including but not limited to, fever, chills, nausea, vomiting, lightheadedness or worsening pain and patient understand the implications of a possible ectopic pregnancy.  Patient was discussed with Dr. Fredderick PhenixBelfi and earlier Dr. Adela LankFloyd who  has seen patient and agrees with assessment and plan.    Final Clinical Impressions(s) / ED Diagnoses   Final diagnoses:  Vaginal bleeding in pregnancy, first trimester  Right lower quadrant abdominal pain    New Prescriptions New Prescriptions   No medications on file     Georgiana ShoreJessica B Damia Bobrowski, PA-C 06/24/16 0046    Rolan BuccoMelanie Belfi, MD 06/24/16 0202

## 2016-06-24 LAB — WET PREP, GENITAL
Clue Cells Wet Prep HPF POC: NONE SEEN
SPERM: NONE SEEN
TRICH WET PREP: NONE SEEN
Yeast Wet Prep HPF POC: NONE SEEN

## 2016-06-24 MED ORDER — ACETAMINOPHEN 500 MG PO TABS: 500.0000 mg | ORAL_TABLET | Freq: Once | ORAL | Status: DC

## 2016-06-24 MED ORDER — ACETAMINOPHEN 325 MG PO TABS: 650.0000 mg | ORAL_TABLET | Freq: Once | ORAL | Status: DC

## 2016-06-24 MED ORDER — ACETAMINOPHEN 325 MG PO TABS
650.0000 mg | ORAL_TABLET | Freq: Once | ORAL | Status: AC
  Administered 2016-06-24: 650 mg via ORAL
  Filled 2016-06-24: qty 2

## 2016-06-25 ENCOUNTER — Encounter (HOSPITAL_COMMUNITY): Payer: Self-pay

## 2016-06-25 ENCOUNTER — Inpatient Hospital Stay (HOSPITAL_COMMUNITY)
Admission: AD | Admit: 2016-06-25 | Discharge: 2016-06-25 | Disposition: A | Discharge status: Home / Self Care | Payer: Medicaid Other | Source: Ambulatory Visit | Attending: Obstetrics & Gynecology | Admitting: Obstetrics & Gynecology

## 2016-06-25 DIAGNOSIS — O3680X0 Pregnancy with inconclusive fetal viability, not applicable or unspecified: Secondary | ICD-10-CM

## 2016-06-25 DIAGNOSIS — O0281 Inappropriate change in quantitative human chorionic gonadotropin (hCG) in early pregnancy: Secondary | ICD-10-CM

## 2016-06-25 DIAGNOSIS — O26891 Other specified pregnancy related conditions, first trimester: Secondary | ICD-10-CM | POA: Insufficient documentation

## 2016-06-25 DIAGNOSIS — Z3A01 Less than 8 weeks gestation of pregnancy: Secondary | ICD-10-CM | POA: Diagnosis not present

## 2016-06-25 LAB — HIV ANTIBODY (ROUTINE TESTING W REFLEX): HIV SCREEN 4TH GENERATION: NONREACTIVE

## 2016-06-25 LAB — RPR: RPR: NONREACTIVE

## 2016-06-25 LAB — HCG, QUANTITATIVE, PREGNANCY: HCG, BETA CHAIN, QUANT, S: 1014 m[IU]/mL — AB (ref <5)

## 2016-06-25 MED ORDER — PRENATAL VITAMIN PLUS LOW IRON 27-1 MG PO TABS: 1.0000 | ORAL_TABLET | Freq: Every day | ORAL | 0 refills | Status: DC

## 2016-06-25 NOTE — MAU Note (Signed)
Patient presents for f/[facility] Friday with right lower quadrant pant, this has gone away, bleeding is now light brown, still have lower abdominal pressure.

## 2016-06-25 NOTE — MAU Provider Note (Signed)
History   Chief Complaint:  Client is here for follow up of BHCG level done [facility]  Gladis RiffleLarhonda Carroll is  30 y.o. G2P0 Patient's last menstrual period was 05/16/2016.Marland Kitchen. Patient is here for follow up of quantitative HCG and ongoing surveillance of pregnancy status.   She is 5616w5d weeks gestation  by LMP.    Since her last visit, the patient is without new complaint.   The patient reports bleeding as  none now.    General ROS:  No bleeding, no abdominal pain - pain from previous visit has resolved.  Does have some pain in groin bilaterally when she is walking a lot.  Resolves when she sits and rests.  Her previous Quantitative HCG values are: 638 on 06-23-16 at 10 pm    Physical Exam   Blood pressure 142/81, pulse 87, temperature 98.5 F (36.9 C), temperature source Oral, resp. rate 18, height 5\' 3"  (1.6 m), weight 217 lb (98.4 kg), last menstrual period 05/16/2016.  Exam: GENERAL: Well-developed, well-nourished female in no acute distress.  HEENT: Normocephalic, atraumatic.  LUNGS: Effort normal  HEART: Regular rate  SKIN: Warm, dry and without erythema  PSYCH: Normal mood and affect  Labs: Results for orders placed or performed during the hospital encounter of 06/25/16 (from the past 24 hour(s))  hCG, quantitative, pregnancy   Collection Time: 06/25/16  9:39 AM  Result Value Ref Range   hCG, Beta Chain, Quant, S 1,014 (H) <5 mIU/mL    Ultrasound Studies:   [place] Ob Comp < 14 Wks  Result Date: 06/23/2016 CLINICAL DATA:  To diffuse midline pelvic cramping x1 day with vaginal spotting x2 days. 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 Beta HCG is pending. EXAM: OBSTETRIC <14 WK US AND TRANSVAGINAL OB US TECHNIQUE: Both transabdominal and transvaginal ultrasound examinations were performed for complete evaluation of the gestation as well as the maternal uterus, adnexal regions, and pelvic cul-de-sac. Transvaginal technique was performed to assess early pregnancy.  COMPARISON:  None for this pregnancy FINDINGS: Intrauterine gestational sac: None Yolk sac:  Not Visualized. Embryo:  Not Visualized. Cardiac Activity: Not Visualized. Heart Rate: Not applicable Subchorionic hemorrhage:  Not applicable Maternal uterus/adnexae: Corpus luteum noted of the left ovary measuring 1.7 x 1.4 x 1.6 cm. No ectopic pregnancy is identified. The right ovary is normal. Endometrial stripe is 17 mm thickness. No uterine mass is identified. Small amount of free fluid is seen about both ovaries. IMPRESSION: No intrauterine or ectopic pregnancy is identified. Electronically Signed   By: Tollie Ethavid  Kwon M.D.   On: 06/23/2016 23:39   [place] Ob Transvaginal  Result Date: 06/23/2016 CLINICAL DATA:  To diffuse midline pelvic cramping x1 day with vaginal spotting x2 days. 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 Beta HCG is pending. EXAM: OBSTETRIC <14 WK US AND TRANSVAGINAL OB US TECHNIQUE: Both transabdominal and transvaginal ultrasound examinations were performed for complete evaluation of the gestation as well as the maternal uterus, adnexal regions, and pelvic cul-de-sac. Transvaginal technique was performed to assess early pregnancy. COMPARISON:  None for this pregnancy FINDINGS: Intrauterine gestational sac: None Yolk sac:  Not Visualized. Embryo:  Not Visualized. Cardiac Activity: Not Visualized. Heart Rate: Not applicable Subchorionic hemorrhage:  Not applicable Maternal uterus/adnexae: Corpus luteum noted of the left ovary measuring 1.7 x 1.4 x 1.6 cm. No ectopic pregnancy is identified. The right ovary is normal. Endometrial stripe is 17 mm thickness. No uterine mass is identified. Small amount of free fluid is seen  about both ovaries. IMPRESSION: No intrauterine or ectopic pregnancy is identified. Electronically Signed   By: Tollie Ethavid  Kwon M.D.   On: 06/23/2016 23:39    Assessment: Pregnancy of unknown anatomic location  221w5d weeks gestation with appropriately increasing quant.  (Did not  quite double but it is 36 hours since last level done)  Plan: Expect ultrasound to call you and schedule an ultrasound on Friday for follow up. Eprescribed prenatal vitamins to her pharmacy. Return here sooner if you have severe abdominal pain or severe vaginal bleeding. Continue pelvic rest until your ultrasound.    Lacye Mccarn L Chesley Valls 06/25/2016, 10:51 AM

## 2016-06-26 LAB — GC/CHLAMYDIA PROBE AMP (~~LOC~~) NOT AT ARMC
CHLAMYDIA, DNA PROBE: NEGATIVE
Neisseria Gonorrhea: NEGATIVE

## 2016-06-27 ENCOUNTER — Inpatient Hospital Stay (HOSPITAL_COMMUNITY): Payer: Medicaid Other | Admitting: Anesthesiology

## 2016-06-27 ENCOUNTER — Ambulatory Visit (HOSPITAL_COMMUNITY)
Admission: AD | Admit: 2016-06-27 | Discharge: 2016-06-27 | DRG: 777 | Disposition: A | Discharge status: Home / Self Care | Payer: Medicaid Other | Source: Ambulatory Visit | Attending: Family Medicine | Admitting: Family Medicine

## 2016-06-27 ENCOUNTER — Encounter (HOSPITAL_COMMUNITY): Payer: Self-pay | Admitting: Radiology

## 2016-06-27 ENCOUNTER — Encounter (HOSPITAL_COMMUNITY)
Admission: AD | Disposition: A | Discharge status: Home / Self Care | Payer: Self-pay | Source: Ambulatory Visit | Attending: Family Medicine

## 2016-06-27 ENCOUNTER — Inpatient Hospital Stay (HOSPITAL_COMMUNITY): Payer: Medicaid Other

## 2016-06-27 DIAGNOSIS — O00101 Right tubal pregnancy without intrauterine pregnancy: Secondary | ICD-10-CM | POA: Diagnosis not present

## 2016-06-27 DIAGNOSIS — Z3A01 Less than 8 weeks gestation of pregnancy: Secondary | ICD-10-CM | POA: Diagnosis not present

## 2016-06-27 DIAGNOSIS — Z6838 Body mass index (BMI) 38.0-38.9, adult: Secondary | ICD-10-CM | POA: Diagnosis not present

## 2016-06-27 DIAGNOSIS — O4691 Antepartum hemorrhage, unspecified, first trimester: Secondary | ICD-10-CM | POA: Diagnosis not present

## 2016-06-27 DIAGNOSIS — R109 Unspecified abdominal pain: Secondary | ICD-10-CM | POA: Diagnosis present

## 2016-06-27 HISTORY — DX: Anxiety disorder, unspecified: F41.9

## 2016-06-27 HISTORY — PX: DIAGNOSTIC LAPAROSCOPY WITH REMOVAL OF ECTOPIC PREGNANCY: SHX6449

## 2016-06-27 LAB — URINALYSIS, ROUTINE W REFLEX MICROSCOPIC
Bilirubin Urine: NEGATIVE
GLUCOSE, UA: NEGATIVE mg/dL
Ketones, ur: 80 mg/dL — AB
Leukocytes, UA: NEGATIVE
NITRITE: NEGATIVE
PROTEIN: 30 mg/dL — AB
SPECIFIC GRAVITY, URINE: 1.023 (ref 1.005–1.030)
pH: 6 (ref 5.0–8.0)

## 2016-06-27 LAB — TYPE AND SCREEN
ABO/RH(D): B POS
Antibody Screen: NEGATIVE

## 2016-06-27 LAB — CBC
HEMATOCRIT: 34.1 % — AB (ref 36.0–46.0)
HEMOGLOBIN: 12 g/dL (ref 12.0–15.0)
MCH: 29.6 pg (ref 26.0–34.0)
MCHC: 35.2 g/dL (ref 30.0–36.0)
MCV: 84.2 fL (ref 78.0–100.0)
Platelets: 247 10*3/uL (ref 150–400)
RBC: 4.05 MIL/uL (ref 3.87–5.11)
RDW: 13.3 % (ref 11.5–15.5)
WBC: 7.6 10*3/uL (ref 4.0–10.5)

## 2016-06-27 LAB — HCG, QUANTITATIVE, PREGNANCY: hCG, Beta Chain, Quant, S: 1562 m[IU]/mL — ABNORMAL HIGH (ref <5)

## 2016-06-27 SURGERY — LAPAROSCOPY, WITH ECTOPIC PREGNANCY SURGICAL TREATMENT
Anesthesia: Choice

## 2016-06-27 MED ORDER — SOD CITRATE-CITRIC ACID 500-334 MG/5ML PO SOLN
30.0000 mL | Freq: Once | ORAL | Status: AC
  Administered 2016-06-27: 30 mL via ORAL
  Filled 2016-06-27: qty 15

## 2016-06-27 MED ORDER — OXYCODONE-ACETAMINOPHEN 5-325 MG PO TABS: 1.0000 | ORAL_TABLET | ORAL | 0 refills | Status: DC | PRN

## 2016-06-27 MED ORDER — FENTANYL CITRATE (PF) 100 MCG/2ML IJ SOLN
INTRAMUSCULAR | Status: DC | PRN
  Administered 2016-06-27: 100 ug via INTRAVENOUS
  Administered 2016-06-27: 50 ug via INTRAVENOUS
  Administered 2016-06-27: 100 ug via INTRAVENOUS

## 2016-06-27 MED ORDER — SUGAMMADEX SODIUM 200 MG/2ML IV SOLN
INTRAVENOUS | Status: DC | PRN
  Administered 2016-06-27: 200 mg via INTRAVENOUS

## 2016-06-27 MED ORDER — BUPIVACAINE HCL (PF) 0.25 % IJ SOLN
INTRAMUSCULAR | Status: DC | PRN
  Administered 2016-06-27: 20 mL

## 2016-06-27 MED ORDER — SODIUM CHLORIDE 0.9 % IV BOLUS (SEPSIS)
1000.0000 mL | Freq: Once | INTRAVENOUS | Status: AC
  Administered 2016-06-27: 1000 mL via INTRAVENOUS

## 2016-06-27 MED ORDER — DEXAMETHASONE SODIUM PHOSPHATE 10 MG/ML IJ SOLN
INTRAMUSCULAR | Status: DC | PRN
  Administered 2016-06-27: 10 mg via INTRAVENOUS

## 2016-06-27 MED ORDER — FENTANYL CITRATE (PF) 250 MCG/5ML IJ SOLN
INTRAMUSCULAR | Status: AC
  Filled 2016-06-27: qty 5

## 2016-06-27 MED ORDER — IBUPROFEN 600 MG PO TABS: 600.0000 mg | ORAL_TABLET | Freq: Four times a day (QID) | ORAL | 1 refills | Status: DC | PRN

## 2016-06-27 MED ORDER — SCOPOLAMINE 1 MG/3DAYS TD PT72
MEDICATED_PATCH | TRANSDERMAL | Status: AC
  Filled 2016-06-27: qty 1

## 2016-06-27 MED ORDER — KETOROLAC TROMETHAMINE 30 MG/ML IJ SOLN
INTRAMUSCULAR | Status: DC | PRN
  Administered 2016-06-27: 30 mg via INTRAVENOUS

## 2016-06-27 MED ORDER — LACTATED RINGERS IV SOLN
INTRAVENOUS | Status: DC | PRN
Start: 2016-06-27 — End: 2016-06-27
  Administered 2016-06-27 (×2): via INTRAVENOUS

## 2016-06-27 MED ORDER — LIDOCAINE HCL (CARDIAC) 20 MG/ML IV SOLN
INTRAVENOUS | Status: DC | PRN
  Administered 2016-06-27: 100 mg via INTRAVENOUS

## 2016-06-27 MED ORDER — FAMOTIDINE IN NACL 20-0.9 MG/50ML-% IV SOLN
20.0000 mg | Freq: Once | INTRAVENOUS | Status: AC
  Administered 2016-06-27: 20 mg via INTRAVENOUS
  Filled 2016-06-27: qty 50

## 2016-06-27 MED ORDER — SUCCINYLCHOLINE CHLORIDE 200 MG/10ML IV SOSY
PREFILLED_SYRINGE | INTRAVENOUS | Status: AC
Start: 2016-06-27 — End: 2016-06-27
  Filled 2016-06-27: qty 10

## 2016-06-27 MED ORDER — KETOROLAC TROMETHAMINE 30 MG/ML IJ SOLN
INTRAMUSCULAR | Status: AC
  Filled 2016-06-27: qty 1

## 2016-06-27 MED ORDER — DEXAMETHASONE SODIUM PHOSPHATE 10 MG/ML IJ SOLN
INTRAMUSCULAR | Status: AC
  Filled 2016-06-27: qty 1

## 2016-06-27 MED ORDER — MIDAZOLAM HCL 2 MG/2ML IJ SOLN
INTRAMUSCULAR | Status: AC
  Filled 2016-06-27: qty 2

## 2016-06-27 MED ORDER — GLYCOPYRROLATE 0.2 MG/ML IJ SOLN
INTRAMUSCULAR | Status: DC | PRN
  Administered 2016-06-27: 0.2 mg via INTRAVENOUS

## 2016-06-27 MED ORDER — SUGAMMADEX SODIUM 500 MG/5ML IV SOLN
INTRAVENOUS | Status: AC
  Filled 2016-06-27: qty 5

## 2016-06-27 MED ORDER — PROPOFOL 10 MG/ML IV BOLUS
INTRAVENOUS | Status: AC
  Filled 2016-06-27: qty 20

## 2016-06-27 MED ORDER — PROPOFOL 10 MG/ML IV BOLUS
INTRAVENOUS | Status: DC | PRN
  Administered 2016-06-27: 200 mg via INTRAVENOUS

## 2016-06-27 MED ORDER — LIDOCAINE HCL (CARDIAC) 20 MG/ML IV SOLN
INTRAVENOUS | Status: AC
  Filled 2016-06-27: qty 5

## 2016-06-27 MED ORDER — ROCURONIUM BROMIDE 100 MG/10ML IV SOLN
INTRAVENOUS | Status: AC
  Filled 2016-06-27: qty 1

## 2016-06-27 MED ORDER — SCOPOLAMINE 1 MG/3DAYS TD PT72
MEDICATED_PATCH | TRANSDERMAL | Status: DC | PRN
  Administered 2016-06-27: 1 via TRANSDERMAL

## 2016-06-27 MED ORDER — SUCCINYLCHOLINE CHLORIDE 20 MG/ML IJ SOLN
INTRAMUSCULAR | Status: DC | PRN
  Administered 2016-06-27: 100 mg via INTRAVENOUS

## 2016-06-27 MED ORDER — ONDANSETRON HCL 4 MG/2ML IJ SOLN
INTRAMUSCULAR | Status: DC | PRN
Start: 2016-06-27 — End: 2016-06-27
  Administered 2016-06-27: 4 mg via INTRAVENOUS

## 2016-06-27 MED ORDER — ONDANSETRON HCL 4 MG/2ML IJ SOLN
INTRAMUSCULAR | Status: AC
  Filled 2016-06-27: qty 2

## 2016-06-27 MED ORDER — DOCUSATE SODIUM 100 MG PO CAPS: 100.0000 mg | ORAL_CAPSULE | Freq: Two times a day (BID) | ORAL | 2 refills | Status: DC | PRN

## 2016-06-27 MED ORDER — MIDAZOLAM HCL 5 MG/5ML IJ SOLN
INTRAMUSCULAR | Status: DC | PRN
  Administered 2016-06-27: 2 mg via INTRAVENOUS

## 2016-06-27 MED ORDER — TRAMADOL HCL 50 MG PO TABS
100.0000 mg | ORAL_TABLET | Freq: Once | ORAL | Status: AC
  Administered 2016-06-27: 100 mg via ORAL
  Filled 2016-06-27: qty 2

## 2016-06-27 MED ORDER — ROCURONIUM BROMIDE 100 MG/10ML IV SOLN
INTRAVENOUS | Status: DC | PRN
  Administered 2016-06-27: 30 mg via INTRAVENOUS

## 2016-06-27 MED ORDER — PHENYLEPHRINE HCL 10 MG/ML IJ SOLN
INTRAMUSCULAR | Status: DC | PRN
  Administered 2016-06-27 (×2): 80 ug via INTRAVENOUS

## 2016-06-27 SURGICAL SUPPLY — 32 items
DERMABOND ADHESIVE PROPEN (GAUZE/BANDAGES/DRESSINGS) ×1
DERMABOND ADVANCED (GAUZE/BANDAGES/DRESSINGS) ×1
DERMABOND ADVANCED .7 DNX12 (GAUZE/BANDAGES/DRESSINGS) ×1 IMPLANT
DERMABOND ADVANCED .7 DNX6 (GAUZE/BANDAGES/DRESSINGS) ×1 IMPLANT
DRSG OPSITE POSTOP 3X4 (GAUZE/BANDAGES/DRESSINGS) ×4 IMPLANT
DURAPREP 26ML APPLICATOR (WOUND CARE) ×2 IMPLANT
ELECT REM PT RETURN 9FT ADLT (ELECTROSURGICAL) ×2
ELECTRODE REM PT RTRN 9FT ADLT (ELECTROSURGICAL) ×1 IMPLANT
GLOVE BIOGEL PI IND STRL 6.5 (GLOVE) ×1 IMPLANT
GLOVE BIOGEL PI IND STRL 7.0 (GLOVE) ×1 IMPLANT
GLOVE BIOGEL PI INDICATOR 6.5 (GLOVE) ×1
GLOVE BIOGEL PI INDICATOR 7.0 (GLOVE) ×1
GLOVE SURG SS PI 6.0 STRL IVOR (GLOVE) ×2 IMPLANT
GOWN STRL REUS W/TWL LRG LVL3 (GOWN DISPOSABLE) ×4 IMPLANT
NS IRRIG 1000ML POUR BTL (IV SOLUTION) ×2 IMPLANT
PACK LAPAROSCOPY BASIN (CUSTOM PROCEDURE TRAY) ×2 IMPLANT
PACK TRENDGUARD 450 HYBRID PRO (MISCELLANEOUS) ×1 IMPLANT
PACK TRENDGUARD 600 HYBRD PROC (MISCELLANEOUS) IMPLANT
POUCH SPECIMEN RETRIEVAL 10MM (ENDOMECHANICALS) ×2 IMPLANT
PROTECTOR NERVE ULNAR (MISCELLANEOUS) ×4 IMPLANT
SET IRRIG TUBING LAPAROSCOPIC (IRRIGATION / IRRIGATOR) ×2 IMPLANT
SHEARS HARMONIC ACE PLUS 36CM (ENDOMECHANICALS) ×2 IMPLANT
SLEEVE XCEL OPT CAN 5 100 (ENDOMECHANICALS) ×2 IMPLANT
SUT MNCRL AB 4-0 PS2 18 (SUTURE) ×2 IMPLANT
SUT VICRYL 0 UR6 27IN ABS (SUTURE) ×4 IMPLANT
TOWEL OR 17X24 6PK STRL BLUE (TOWEL DISPOSABLE) ×4 IMPLANT
TRAY FOLEY CATH SILVER 14FR (SET/KITS/TRAYS/PACK) ×2 IMPLANT
TRENDGUARD 450 HYBRID PRO PACK (MISCELLANEOUS) ×2
TRENDGUARD 600 HYBRID PROC PK (MISCELLANEOUS)
TROCAR BALLN 12MMX100 BLUNT (TROCAR) ×2 IMPLANT
TROCAR XCEL NON-BLD 5MMX100MML (ENDOMECHANICALS) ×2 IMPLANT
WATER STERILE IRR 1000ML POUR (IV SOLUTION) ×2 IMPLANT

## 2016-06-27 NOTE — H&P (Signed)
History    First Provider Initiated SolicitorContact with Patient 06/27/16 [phone]      Chief Complaint:  Abdominal Pain   Sheila Carroll is  30 y.o. [phone] pregnant female who presents to MAU reporting increased abd pain and continued light vaginal bleeding. Patient's last menstrual period was 05/16/2016. She is 1533w0d weeks gestation  by LMP, but US 06/23/16 did not correspond with that.     Since her last visit, the patient is with new complaint.     ROS Abdomin Pain: Increase to 6-9/10-worse w/ mvmt.  Vaginal bleeding: lighter than period.   Passage of clots or tissue: None Dizziness: None  Her previous Quantitative HCG values are: Results for Sheila RiffleBREEDEN, Sherrey (MRN [id number]) as of 06/27/2016 06:13  Ref. Range 06/23/2016 22:52 06/25/2016 09:39  HCG, Beta Chain, Quant, S Latest Ref Range: <5 mIU/mL 639 (H) 1,014 (H)   Previous US showed nothing in the uterus or adnexa.   Physical Exam   BP 157/77 (BP Location: Right Arm)   Pulse 89   Temp 98.3 F (36.8 C) (Oral)   Resp 18   LMP 05/16/2016  Constitutional: Well-nourished female in no apparent distress. No pallor. Neuro: Alert and oriented 4 Cardiovascular: Normal rate Respiratory: Normal effort and rate Abdomen: Soft, moderate generalized TTP greatest at RLQ. Neg rebound tenderness or mass.  Gynecological Exam: examination not indicated  Labs: Results for orders placed or performed during the hospital encounter of 06/27/16 (from the past 24 hour(s))  Urinalysis, Routine w reflex microscopic   Collection Time: 06/27/16  5:16 AM  Result Value Ref Range   Color, Urine YELLOW YELLOW   APPearance CLOUDY (A) CLEAR   Specific Gravity, Urine 1.023 1.005 - 1.030   pH 6.0 5.0 - 8.0   Glucose, UA NEGATIVE NEGATIVE mg/dL   Hgb urine dipstick LARGE (A) NEGATIVE   Bilirubin Urine NEGATIVE NEGATIVE   Ketones, ur 80 (A) NEGATIVE mg/dL   Protein, ur 30 (A) NEGATIVE mg/dL   Nitrite NEGATIVE NEGATIVE   Leukocytes, UA NEGATIVE NEGATIVE   RBC / HPF 6-30 0 - 5 RBC/hpf   WBC, UA 6-30 0 - 5 WBC/hpf   Bacteria, UA RARE (A) NONE SEEN   Squamous Epithelial / LPF 6-30 (A) NONE SEEN   Mucous PRESENT   CBC   Collection Time: 06/27/16  6:03 AM  Result Value Ref Range   WBC 7.6 4.0 - 10.5 K/uL   RBC 4.05 3.87 - 5.11 MIL/uL   Hemoglobin 12.0 12.0 - 15.0 g/dL   HCT 96.234.1 (L) 95.236.0 - 84.146.0 %   MCV 84.2 78.0 - 100.0 fL   MCH 29.6 26.0 - 34.0 pg   MCHC 35.2 30.0 - 36.0 g/dL   RDW 32.413.3 40.111.5 - 02.715.5 %   Platelets 247 150 - 400 K/uL  hCG, quantitative, pregnancy   Collection Time: 06/27/16  6:03 AM  Result Value Ref Range   hCG, Beta Chain, Quant, S 1,562 (H) <5 mIU/mL    Ultrasound Studies:   [place] Ob Comp < 14 Wks  Result Date: 06/23/2016 CLINICAL DATA:  To diffuse midline pelvic cramping x1 day with vaginal spotting x2 days. 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 Beta HCG is pending. EXAM: OBSTETRIC <14 WK US AND TRANSVAGINAL OB US TECHNIQUE: Both transabdominal and transvaginal ultrasound examinations were performed for complete evaluation of the gestation as well as the maternal uterus, adnexal regions, and pelvic cul-de-sac. Transvaginal technique was performed to assess early pregnancy. COMPARISON:  None for this pregnancy FINDINGS:  Intrauterine gestational sac: None Yolk sac:  Not Visualized. Embryo:  Not Visualized. Cardiac Activity: Not Visualized. Heart Rate: Not applicable Subchorionic hemorrhage:  Not applicable Maternal uterus/adnexae: Corpus luteum noted of the left ovary measuring 1.7 x 1.4 x 1.6 cm. No ectopic pregnancy is identified. The right ovary is normal. Endometrial stripe is 17 mm thickness. No uterine mass is identified. Small amount of free fluid is seen about both ovaries. IMPRESSION: No intrauterine or ectopic pregnancy is identified. Electronically Signed   By: Tollie Ethavid  Kwon M.D.   On: 06/23/2016 23:39   [place] Ob Transvaginal  Result Date: 06/23/2016 CLINICAL DATA:  To diffuse midline pelvic cramping x1 day with  vaginal spotting x2 days. 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 Beta HCG is pending. EXAM: OBSTETRIC <14 WK US AND TRANSVAGINAL OB US TECHNIQUE: Both transabdominal and transvaginal ultrasound examinations were performed for complete evaluation of the gestation as well as the maternal uterus, adnexal regions, and pelvic cul-de-sac. Transvaginal technique was performed to assess early pregnancy. COMPARISON:  None for this pregnancy FINDINGS: Intrauterine gestational sac: None Yolk sac:  Not Visualized. Embryo:  Not Visualized. Cardiac Activity: Not Visualized. Heart Rate: Not applicable Subchorionic hemorrhage:  Not applicable Maternal uterus/adnexae: Corpus luteum noted of the left ovary measuring 1.7 x 1.4 x 1.6 cm. No ectopic pregnancy is identified. The right ovary is normal. Endometrial stripe is 17 mm thickness. No uterine mass is identified. Small amount of free fluid is seen about both ovaries. IMPRESSION: No intrauterine or ectopic pregnancy is identified. Electronically Signed   By: Tollie Ethavid  Kwon M.D.   On: 06/23/2016 23:39    Transvaginal US 06/27/16 CLINICAL DATA:  Abdominal pain.  Pregnancy.  EXAM: TRANSVAGINAL OB ULTRASOUND  TECHNIQUE: Transvaginal ultrasound was performed for complete evaluation of the gestation as well as the maternal uterus, adnexal regions, and pelvic cul-de-sac.  COMPARISON:  06/23/2016.  FINDINGS: Intrauterine gestational sac: None visualized  Yolk sac:  None visualized  Embryo:  None visualized  Cardiac Activity: None visualized  Subchorionic hemorrhage:  None visualized.  Maternal uterus/adnexae: 1.3 cm complex cyst left ovary adjacent 1.3 cm simple cyst.  In the cul de sac is a large amount of echogenic material and adjacent fluid. This is suspicious for hemorrhage. Ectopic pregnancy cannot be excluded.  IMPRESSION: No intrauterine gestational noted. A 1.3 cm complex cyst is in the left ovary. Large amount of echogenic  material is noted in the pelvis consistent with clot. Adjacent fluid noted. Ectopic pregnancy cannot be excluded.  Critical Value/emergent results were called by telephone at the time of interpretation on 06/27/2016 at 6:54 am to Dr. Dorathy KinsmanVIRGINIA SMITH , who verbally acknowledged these results.   Electronically Signed   By: Maisie Fushomas  Register   On: 06/27/2016 06:57 MAU course/MDM: US, CBC, Quantitative hCG ordered. Tramadol ordered.   Abnormal rise in quant.   Radiologist called to notify CNM of what appears to be large blood in the abdomen. No IUP or adnexal mass seen, but likely ruptured ectopic due to moderate complex fluid in the abd. Dr. Adrian BlackwaterStinson notified of US results. T&S ordered. Will come discuss POC w/ Pt. Last ate at 2130. Sip of water w/ tramadol at 0615.  Assessment: Suspected ruptured ectopic pregnancy, hemodynamically stable. No IUP.   Plan: Patient NPO since last night (9pm).  Discussed Laparoscopic surgery with patient - risks discussed including bleeding, infection, injury to adjacent organs, blood clots.  Will prepare patient for OR with Dr Jolayne Pantheronstant. No prophylactic antibiotics needed.  Rhona RaiderJacob J  Lyrica Mcclarty, DO 06/27/2016 7:38 AM

## 2016-06-27 NOTE — MAU Note (Signed)
Pt presents with lower abdominal cramping and back pain. Started on Friday but much more intense tonight. Has some spotting, is about that same as it was Friday, but is a little more red than it as been. Was seen at [facility] on Friday and had a follow up hcg on Sunday. Is suppose to have an u/s scheduled. Rates 7/10. Took a low dose aspirin and Tylenol-helped some.

## 2016-06-27 NOTE — Anesthesia Procedure Notes (Addendum)
Procedure Name: Intubation Date/Time: 06/27/2016 8:37 AM Performed by: Junious SilkGILBERT, Naina Sleeper Pre-anesthesia Checklist: Patient identified, Emergency Drugs available, Suction available and Patient being monitored Patient Re-evaluated:Patient Re-evaluated prior to inductionOxygen Delivery Method: Circle system utilized Preoxygenation: Pre-oxygenation with 100% oxygen Intubation Type: IV induction, Rapid sequence and Cricoid Pressure applied Laryngoscope Size: Miller and 2 Grade View: Grade I Tube type: Oral Tube size: 7.0 mm Number of attempts: 1 Airway Equipment and Method: Stylet Placement Confirmation: ETT inserted through vocal cords under direct vision,  breath sounds checked- equal and bilateral,  positive ETCO2 and CO2 detector Secured at: 21 cm Tube secured with: Tape Dental Injury: Teeth and Oropharynx as per pre-operative assessment

## 2016-06-27 NOTE — MAU Note (Signed)
Dr. Jean RosenthalJackson in to discuss anesthesia with pt.

## 2016-06-27 NOTE — Transfer of Care (Signed)
Immediate Anesthesia Transfer of Care Note  Patient: Sheila RiffleLarhonda Shimmel  Procedure(s) Performed: Procedure(s): DIAGNOSTIC LAPAROSCOPY WITH REMOVAL OF ECTOPIC PREGNANCY (N/A)  Patient Location: PACU  Anesthesia Type:General  Level of Consciousness: awake, alert  and oriented  Airway & Oxygen Therapy: Patient Spontanous Breathing and Patient connected to nasal cannula oxygen  Post-op Assessment: Report given to RN and Post -op Vital signs reviewed and stable  Post vital signs: Reviewed and stable  Last Vitals:  Vitals:   06/27/16 0528 06/27/16 0801  BP: 157/77 141/59  Pulse: 89 79  Resp: 18 22  Temp: 36.8 C     Last Pain:  Vitals:   06/27/16 0656  TempSrc:   PainSc: 4          Complications: No apparent anesthesia complications

## 2016-06-27 NOTE — Discharge Instructions (Signed)
Diagnostic Laparoscopy, Care After Introduction Refer to this sheet in the next few weeks. These instructions provide you with information about caring for yourself after your procedure. Your health care provider may also give you more specific instructions. Your treatment has been planned according to current medical practices, but problems sometimes occur. Call your health care provider if you have any problems or questions after your procedure. What can I expect after the procedure? After your procedure, it is common to have mild discomfort in the throat and abdomen. Follow these instructions at home:  Take over-the-counter and prescription medicines only as told by your health care provider.  Do not drive for 24 hours if you received a sedative.  Return to your normal activities as told by your health care provider.  Do not take baths, swim, or use a hot tub until your health care provider approves. You may shower.  Follow instructions from your health care provider about how to take care of your incision. Make sure you:  Wash your hands with soap and water before you change your bandage (dressing). If soap and water are not available, use hand sanitizer.  Change your dressing as told by your health care provider.  Leave stitches (sutures), skin glue, or adhesive strips in place. These skin closures may need to stay in place for 2 weeks or longer. If adhesive strip edges start to loosen and curl up, you may trim the loose edges. Do not remove adhesive strips completely unless your health care provider tells you to do that.  Check your incision area every day for signs of infection. Check for:  More redness, swelling, or pain.  More fluid or blood.  Warmth.  Pus or a bad smell.  It is your responsibility to get the results of your procedure. Ask your health care provider or the department performing the procedure when your results will be ready. Contact a health care provider  if:  There is new pain in your shoulders.  You feel light-headed or faint.  You are unable to pass gas or unable to have a bowel movement.  You feel nauseous or you vomit.  You develop a rash.  You have more redness, swelling, or pain around your incision.  You have more fluid or blood coming from your incision.  Your incision feels warm to the touch.  You have pus or a bad smell coming from your incision.  You have a fever or chills. Get help right away if:  Your pain is getting worse.  You have ongoing vomiting.  The edges of your incision open up.  You have trouble breathing.  You have chest pain. This information is not intended to replace advice given to you by your health care provider. Make sure you discuss any questions you have with your health care provider. Document Released: 06/14/2015 Document Revised: 12/09/2015 Document Reviewed: 03/16/2015  2017 Elsevier   Ectopic Pregnancy An ectopic pregnancy is when the fertilized egg attaches (implants) outside the uterus. Most ectopic pregnancies occur in one of the tubes where eggs travel from the ovary to the uterus (fallopian tubes), but the implanting can occur in other locations. In rare cases, ectopic pregnancies occur on the ovary, intestine, pelvis, abdomen, or cervix. In an ectopic pregnancy, the fertilized egg does not have the ability to develop into a normal, healthy baby. A ruptured ectopic pregnancy is one in which tearing or bursting of a fallopian tube causes internal bleeding. Often, there is intense lower abdominal pain, and  vaginal bleeding sometimes occurs. Having an ectopic pregnancy can be life-threatening. If this dangerous condition is not treated, it can lead to blood loss, shock, or even death. What are the causes? The most common cause of this condition is damage to one of the fallopian tubes. A fallopian tube may be narrowed or blocked, and that keeps the fertilized egg from reaching the  uterus. What increases the risk? This condition is more likely to develop in women of childbearing age who have different levels of risk. The levels of risk can be divided into three categories. High risk You have gone through infertility treatment. You have had an ectopic pregnancy before. You have had surgery on the fallopian tubes, or another surgical procedure, such as an abortion. You have had surgery to have the fallopian tubes tied (tubal ligation). You have problems or diseases of the fallopian tubes. You have been exposed to diethylstilbestrol (DES). This medicine was used until 1971, and it had effects on babies whose mothers took the medicine. You become pregnant while using an IUD (intrauterine device) for birth control. Moderate risk You have a history of infertility. You have had an STI (sexually transmitted infection). You have a history of pelvic inflammatory disease (PID). You have scarring from endometriosis. You have multiple sexual partners. You smoke. Low risk You have had pelvic surgery. You use vaginal douches. You became sexually active before age 95. What are the signs or symptoms? Common symptoms of this condition include normal pregnancy symptoms, such as missing a period, nausea, tiredness, abdominal pain, breast tenderness, and bleeding. However, ectopic pregnancy will have additional symptoms, such as: Pain with intercourse. Irregular vaginal bleeding or spotting. Cramping or pain on one side or in the lower abdomen. Fast heartbeat, low blood pressure, and sweating. Passing out while having a bowel movement. Symptoms of a ruptured ectopic pregnancy and internal bleeding may include: Sudden, severe pain in the abdomen and pelvis. Dizziness, weakness, light-headedness, or fainting. Pain in the shoulder or neck area. How is this diagnosed? This condition is diagnosed by: A pelvic exam to locate pain or a mass in the abdomen. A pregnancy test. This blood  test checks for the presence as well as the specific level of pregnancy hormone in the bloodstream. Ultrasound. This is performed if a pregnancy test is positive. In this test, a probe is inserted into the vagina. The probe will detect a fetus, possibly in a location other than the uterus. Taking a sample of uterus tissue (dilation and curettage, or D&C). Surgery to perform a visual exam of the inside of the abdomen using a thin, lighted tube that has a tiny camera on the end (laparoscope). Culdocentesis. This procedure involves inserting a needle at the top of the vagina, behind the uterus. If blood is present in this area, it may indicate that a fallopian tube is torn. How is this treated? This condition is treated with medicine or surgery. Medicine An injection of a medicine (methotrexate) may be given to cause the pregnancy tissue to be absorbed. This medicine may save your fallopian tube. It may be given if: The diagnosis is made early, with no signs of active bleeding. The fallopian tube has not ruptured. You are considered to be a good candidate for the medicine. Usually, pregnancy hormone blood levels are checked after methotrexate treatment. This is to be sure that the medicine is effective. It may take 4-6 weeks for the pregnancy to be absorbed. Most pregnancies will be absorbed by 3 weeks. Surgery A  laparoscope may be used to remove the pregnancy tissue. If severe internal bleeding occurs, a larger cut (incision) may be made in the lower abdomen (laparotomy) to remove the fetus and placenta. This is done to stop the bleeding. Part or all of the fallopian tube may be removed (salpingectomy) along with the fetus and placenta. The fallopian tube may also be repaired during the surgery. In very rare circumstances, removal of the uterus (hysterectomy) may be required. After surgery, pregnancy hormone testing may be done to be sure that there is no pregnancy tissue left. Whether your  treatment is medicine or surgery, you may receive a Rho (D) immune globulin shot to prevent problems with any future pregnancy. This shot may be given if: You are Rh-negative and the baby's father is Rh-positive. You are Rh-negative and you do not know the Rh type of the baby's father. Follow these instructions at home: Rest and limit your activity after the procedure for as long as told by your health care provider. Until your health care provider says that it is safe: Do not lift anything that is heavier than 10 lb (4.5 kg), or the limit that your health care provider tells you. Avoid physical exercise and any movement that requires effort (is strenuous). To help prevent constipation: Eat a healthy diet that includes fruits, vegetables, and whole grains. Drink 6-8 glasses of water per day. Get help right away if: You develop worsening pain that is not relieved by medicine. You have: A fever or chills. Vaginal bleeding. Redness and swelling at the incision site. Nausea and vomiting. You feel dizzy or weak. You feel light-headed or you faint. This information is not intended to replace advice given to you by your health care provider. Make sure you discuss any questions you have with your health care provider. Document Released: 08/10/2004 Document Revised: 03/01/2016 Document Reviewed: 02/02/2016 Elsevier Interactive Patient Education  2017 [employer]   DISCHARGE INSTRUCTIONS: Laparoscopy(ectopic removal)  The following instructions have been prepared to help you care for yourself upon your return home today.  **You may begin taking Ibuprofen(Motrin, Advil, Aleve) after 3:36 pm today** Wound care:  Do not get the incision wet for the first 24 hours. The incision should be kept clean and dry.  The Band-Aids or dressings may be removed the day after surgery.  Should the incision become sore, red, and swollen after the first week, check with your doctor.  Personal hygiene:   Shower the day after your procedure.  Activity and limitations:  Do NOT drive or operate any equipment today.  Do NOT lift anything more than 15 pounds for 2-3 weeks after surgery.  Do NOT rest in bed all day.  Walking is encouraged. Walk each day, starting slowly with 5-minute walks 3 or 4 times a day. Slowly increase the length of your walks.  Walk up and down stairs slowly.  Do NOT do strenuous activities, such as golfing, playing tennis, bowling, running, biking, weight lifting, gardening, mowing, or vacuuming for 2-4 weeks. Ask your doctor when it is okay to start.  Diet: Eat a light meal as desired this evening. You may resume your usual diet tomorrow.  Return to work: This is dependent on the type of work you do. For the most part you can return to a desk job within a week of surgery. If you are more active at work, please discuss this with your doctor.  What to expect after your surgery: You may have a slight burning sensation when  you urinate on the first day. You may have a very small amount of blood in the urine. Expect to have a small amount of vaginal discharge/light bleeding for 1-2 weeks. It is not unusual to have abdominal soreness and bruising for up to 2 weeks. You may be tired and need more rest for about 1 week. You may experience shoulder pain for 24-72 hours. Lying flat in bed may relieve it.  Call your doctor for any of the following:  Develop a fever of 100.4 or greater  Inability to urinate 6 hours after discharge from hospital  Severe pain not relieved by pain medications  Persistent of heavy bleeding at incision site  Redness or swelling around incision site after a week  Increasing nausea or vomiting  Patient Signature________________________________________  Nurse Signature_________________________________________  Support Person's signature______________________________

## 2016-06-27 NOTE — Anesthesia Preprocedure Evaluation (Signed)
Anesthesia Evaluation  Patient identified by MRN, date of birth, ID band Patient awake    Reviewed: Allergy & Precautions, H&P , Patient's Chart, lab work & pertinent test results, reviewed documented beta blocker date and time   Airway Mallampati: II  TM Distance: >3 FB Neck ROM: full    Dental no notable dental hx.    Pulmonary    Pulmonary exam normal breath sounds clear to auscultation       Cardiovascular  Rhythm:regular Rate:Normal     Neuro/Psych    GI/Hepatic   Endo/Other  Morbid obesity  Renal/GU      Musculoskeletal   Abdominal   Peds  Hematology   Anesthesia Other Findings NPO okay   Reproductive/Obstetrics                             Anesthesia Physical Anesthesia Plan  ASA: III  Anesthesia Plan: General   Post-op Pain Management:    Induction: Intravenous  Airway Management Planned: Oral ETT  Additional Equipment:   Intra-op Plan:   Post-operative Plan: Extubation in OR  Informed Consent: I have reviewed the patients History and Physical, chart, labs and discussed the procedure including the risks, benefits and alternatives for the proposed anesthesia with the patient or authorized representative who has indicated his/her understanding and acceptance.   Dental Advisory Given and Dental advisory given  Plan Discussed with: CRNA and Surgeon  Anesthesia Plan Comments: (  Discussed general anesthesia, including possible nausea, instrumentation of airway, sore throat,pulmonary aspiration, etc. I asked if the were any outstanding questions, or  concerns before we proceeded. )        Anesthesia Quick Evaluation

## 2016-06-27 NOTE — MAU Note (Signed)
Dr. Adrian BlackwaterStinson in to discuss surgery with pt, pt very anxious, crying.  Support given.

## 2016-06-27 NOTE — MAU Provider Note (Signed)
History    First Provider Initiated SolicitorContact with Patient 06/27/16 [phone]      Chief Complaint:  Abdominal Pain   Sheila Carroll is  30 y.o. [phone] pregnant female who presents to [facility]U reporting increased abd pain and continued light vaginal bleeding. Patient's last menstrual period was 05/16/2016. She is 1533w0d weeks gestation  by LMP, but US 06/23/16 did not correspond with that.     Since her last visit, the patient is with new complaint.     ROS Abdomin Pain: Increase to 6-9/10-worse w/ mvmt.  Vaginal bleeding: lighter than period.   Passage of clots or tissue: None Dizziness: None  Her previous Quantitative HCG values are: Results for Sheila Carroll, Sheila (MRN [id number]) as of 06/27/2016 06:13  Ref. Range 06/23/2016 22:52 06/25/2016 09:39  HCG, Beta Chain, Quant, S Latest Ref Range: <5 mIU/mL 639 (H) 1,014 (H)   Previous US showed nothing in the uterus or adnexa.   Physical Exam   BP 157/77 (BP Location: Right Arm)   Pulse 89   Temp 98.3 F (36.8 C) (Oral)   Resp 18   LMP 05/16/2016  Constitutional: Well-nourished female in no apparent distress. No pallor. Neuro: Alert and oriented 4 Cardiovascular: Normal rate Respiratory: Normal effort and rate Abdomen: Soft, moderate generalized TTP greatest at RLQ. Neg rebound tenderness or mass.  Gynecological Exam: examination not indicated  Labs: Results for orders placed or performed during the hospital encounter of 06/27/16 (from the past 24 hour(s))  Urinalysis, Routine w reflex microscopic   Collection Time: 06/27/16  5:16 AM  Result Value Ref Range   Color, Urine YELLOW YELLOW   APPearance CLOUDY (A) CLEAR   Specific Gravity, Urine 1.023 1.005 - 1.030   pH 6.0 5.0 - 8.0   Glucose, UA NEGATIVE NEGATIVE mg/dL   Hgb urine dipstick LARGE (A) NEGATIVE   Bilirubin Urine NEGATIVE NEGATIVE   Ketones, ur 80 (A) NEGATIVE mg/dL   Protein, ur 30 (A) NEGATIVE mg/dL   Nitrite NEGATIVE NEGATIVE   Leukocytes, UA NEGATIVE NEGATIVE   RBC / HPF 6-30 0 - 5 RBC/hpf   WBC, UA 6-30 0 - 5 WBC/hpf   Bacteria, UA RARE (A) NONE SEEN   Squamous Epithelial / LPF 6-30 (A) NONE SEEN   Mucous PRESENT   CBC   Collection Time: 06/27/16  6:03 AM  Result Value Ref Range   WBC 7.6 4.0 - 10.5 K/uL   RBC 4.05 3.87 - 5.11 MIL/uL   Hemoglobin 12.0 12.0 - 15.0 g/dL   HCT 96.234.1 (L) 95.236.0 - 84.146.0 %   MCV 84.2 78.0 - 100.0 fL   MCH 29.6 26.0 - 34.0 pg   MCHC 35.2 30.0 - 36.0 g/dL   RDW 32.413.3 40.111.5 - 02.715.5 %   Platelets 247 150 - 400 K/uL  hCG, quantitative, pregnancy   Collection Time: 06/27/16  6:03 AM  Result Value Ref Range   hCG, Beta Chain, Quant, S 1,562 (H) <5 mIU/mL    Ultrasound Studies:   [place] Ob Comp < 14 Wks  Result Date: 06/23/2016 CLINICAL DATA:  To diffuse midline pelvic cramping x1 day with vaginal spotting x2 days. 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 Beta HCG is pending. EXAM: OBSTETRIC <14 WK US AND TRANSVAGINAL OB US TECHNIQUE: Both transabdominal and transvaginal ultrasound examinations were performed for complete evaluation of the gestation as well as the maternal uterus, adnexal regions, and pelvic cul-de-sac. Transvaginal technique was performed to assess early pregnancy. COMPARISON:  None for this pregnancy FINDINGS:  Intrauterine gestational sac: None Yolk sac:  Not Visualized. Embryo:  Not Visualized. Cardiac Activity: Not Visualized. Heart Rate: Not applicable Subchorionic hemorrhage:  Not applicable Maternal uterus/adnexae: Corpus luteum noted of the left ovary measuring 1.7 x 1.4 x 1.6 cm. No ectopic pregnancy is identified. The right ovary is normal. Endometrial stripe is 17 mm thickness. No uterine mass is identified. Small amount of free fluid is seen about both ovaries. IMPRESSION: No intrauterine or ectopic pregnancy is identified. Electronically Signed   By: Tollie Ethavid  Kwon M.D.   On: 06/23/2016 23:39   [place] Ob Transvaginal  Result Date: 06/23/2016 CLINICAL DATA:  To diffuse midline pelvic cramping x1 day with  vaginal spotting x2 days. 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 Beta HCG is pending. EXAM: OBSTETRIC <14 WK US AND TRANSVAGINAL OB US TECHNIQUE: Both transabdominal and transvaginal ultrasound examinations were performed for complete evaluation of the gestation as well as the maternal uterus, adnexal regions, and pelvic cul-de-sac. Transvaginal technique was performed to assess early pregnancy. COMPARISON:  None for this pregnancy FINDINGS: Intrauterine gestational sac: None Yolk sac:  Not Visualized. Embryo:  Not Visualized. Cardiac Activity: Not Visualized. Heart Rate: Not applicable Subchorionic hemorrhage:  Not applicable Maternal uterus/adnexae: Corpus luteum noted of the left ovary measuring 1.7 x 1.4 x 1.6 cm. No ectopic pregnancy is identified. The right ovary is normal. Endometrial stripe is 17 mm thickness. No uterine mass is identified. Small amount of free fluid is seen about both ovaries. IMPRESSION: No intrauterine or ectopic pregnancy is identified. Electronically Signed   By: Tollie Ethavid  Kwon M.D.   On: 06/23/2016 23:39    Transvaginal US 06/27/16 CLINICAL DATA:  Abdominal pain.  Pregnancy.  EXAM: TRANSVAGINAL OB ULTRASOUND  TECHNIQUE: Transvaginal ultrasound was performed for complete evaluation of the gestation as well as the maternal uterus, adnexal regions, and pelvic cul-de-sac.  COMPARISON:  06/23/2016.  FINDINGS: Intrauterine gestational sac: None visualized  Yolk sac:  None visualized  Embryo:  None visualized  Cardiac Activity: None visualized  Subchorionic hemorrhage:  None visualized.  Maternal uterus/adnexae: 1.3 cm complex cyst left ovary adjacent 1.3 cm simple cyst.  In the cul de sac is a large amount of echogenic material and adjacent fluid. This is suspicious for hemorrhage. Ectopic pregnancy cannot be excluded.  IMPRESSION: No intrauterine gestational noted. A 1.3 cm complex cyst is in the left ovary. Large amount of echogenic  material is noted in the pelvis consistent with clot. Adjacent fluid noted. Ectopic pregnancy cannot be excluded.  Critical Value/emergent results were called by telephone at the time of interpretation on 06/27/2016 at 6:54 am to Dr. Dorathy KinsmanVIRGINIA Frankee Gritz , who verbally acknowledged these results.   Electronically Signed   By: Maisie Fushomas  Register   On: 06/27/2016 06:57 MAU course/MDM: US, CBC, Quantitative hCG ordered. Tramadol ordered.   Abnormal rise in quant.   Radiologist called to notify CNM of what appears to be large blood in the abdomen. No IUP or adnexal mass seen, but likely ruptured ectopic due to moderate complex fluid in the abd. Dr. Adrian BlackwaterStinson notified of US results. T&S ordered. Will come discuss POC w/ Pt. Last ate at 2130. Sip of water w/ tramadol at 0615.  Pt notified. Very anxious, tearful. Asked CNM to call husband and Mother and request they come.   Assessment: Suspected ruptured ectopic pregnancy, hemodynamically stable. No IUP.   Plan: Prep for OR  [place] Moise Friday, CNM 06/27/2016, 5:45 AM  2/3

## 2016-06-27 NOTE — Op Note (Signed)
Sheila Carroll PROCEDURE DATE: 06/27/2016  PREOPERATIVE DIAGNOSIS: Ruptured ectopic pregnancy POSTOPERATIVE DIAGNOSIS: Ruptured right fallopian tube ectopic pregnancy PROCEDURE: Laparoscopic right salpingectomy and removal of ectopic pregnancy SURGEON:  Dr. Catalina AntiguaPeggy Jacobus Colvin ASSISTANT: none ANESTHESIOLOGIST: Cristela BlueKyle Jackson, MD Anesthesiologist: Cristela BlueKyle Jackson, MD CRNA: Junious SilkMelinda Gilbert, CRNA  INDICATIONS: 30 y.o. [id number] at 7481w0d here for with ruptured ectopic pregnancy. On exam, she had stable vital signs, and an acute abdomen. Hgb  Lab Results  Component Value Date   HGB 12.0 06/27/2016   , blood type B POS . Patient was counseled regarding need for laparoscopic salpingectomy. Risks of surgery including bleeding which may require transfusion or reoperation, infection, injury to bowel or other surrounding organs, need for additional procedures including laparotomy and other postoperative/anesthesia complications were explained to patient.  Written informed consent was obtained.  FINDINGS:  Moderate amount of hemoperitoneum estimated to be about 50cc of blood and clots.  Dilated right fallopian tube containing ectopic gestation. Small normal appearing uterus, normal left fallopian tube, right ovary and left ovary.  ANESTHESIA: General INTRAVENOUS FLUIDS: .1000 ml ESTIMATED BLOOD LOSS: minimal with approximately 50 ml of clots URINE OUTPUT: 100 ml SPECIMENS: right fallopian tube containing ectopic gestation COMPLICATIONS: None immediate  PROCEDURE IN DETAIL:  The patient was taken to the operating room where general anesthesia was administered and was found to be adequate.  She was placed in the dorsal lithotomy position, and was prepped and draped in a sterile manner.  A Foley catheter was inserted into her bladder and attached to Kerri Asche drainage and a uterine manipulator was then advanced into the uterus .  After an adequate timeout was performed, attention was then turned to the patient's  abdomen where a 10-mm skin incision was made on the umbilical fold.  The fascia was identified, tented up and incised with Mayo scissors. The fascia was tagged with 0- Vicryl. The peritoneum was identified tented up and entered sharply with Metzenbaum scissors.  10-mm trocar and sleeve were then advanced without difficulty into the abdomen where intraabdominal placement was confirmed by the laparoscope. Pneumoperitoneum was achieved with insufflation of CO2 gas. A survey of the patient's pelvis and abdomen revealed the findings as above.  Two lower quadrant ports were placed under direct visualization; 5-mm on the left and the other 5-mm 5 cm cephalad from the first one.  The 5-mm Nezhat suction irrigator was then used to suction the hemoperitoneum and irrigate the pelvis.  Attention was then turned to the right fallopian tube which was grasped and ligated from the underlying mesosalpinx and uterine attachment using the Enseal instrument.  Good hemostasis was noted.  The specimen was placed in an EndoCatch bag and removed from the abdomen intact.  The abdomen was desufflated, and all instruments were removed.  The fascial incisions of both 10-mm sites were reapproximated with 0 Vicryl figure-of-eight stiches; and all skin incisions were closed with a 3-0 Vicryl subcuticular stitch. The patient tolerated the procedures well.  All instruments, needles, and sponge counts were correct x 2. The patient was taken to the recovery room in stable condition.   The patient will be discharged to home as per PACU criteria.  Routine postoperative instructions given.  She was prescribed Percocet, Ibuprofen and Colace.  She will follow up in our office in 2 weeks for postoperative evaluation .

## 2016-06-28 NOTE — Anesthesia Postprocedure Evaluation (Signed)
Anesthesia Post Note  Patient: Sheila Carroll  Procedure(s) Performed: Procedure(s) (LRB): DIAGNOSTIC LAPAROSCOPY WITH REMOVAL OF ECTOPIC PREGNANCY (N/A)  Patient location during evaluation: PACU Anesthesia Type: General Level of consciousness: sedated Pain management: satisfactory to patient Vital Signs Assessment: post-procedure vital signs reviewed and stable Respiratory status: spontaneous breathing Cardiovascular status: stable Anesthetic complications: no    Last Vitals:  Vitals:   06/27/16 1000 06/27/16 1015  BP: (!) 128/52   Pulse: 89 95  Resp: (!) 21 15  Temp: 36.7 C     Last Pain:  Vitals:   06/27/16 0656  TempSrc:   PainSc: 4                  Sylvester Minton EDWARD

## 2016-06-28 NOTE — Addendum Note (Signed)
Addendum  created 06/28/16 2218 by Cristela BlueKyle Zuri Lascala, MD   Anesthesia Attestations filed

## 2016-06-29 ENCOUNTER — Encounter (HOSPITAL_COMMUNITY): Payer: Self-pay | Admitting: Obstetrics and Gynecology

## 2016-07-18 ENCOUNTER — Ambulatory Visit (INDEPENDENT_AMBULATORY_CARE_PROVIDER_SITE_OTHER): Payer: Medicaid Other | Admitting: Obstetrics and Gynecology

## 2016-07-18 ENCOUNTER — Encounter: Payer: Self-pay | Admitting: *Deleted

## 2016-07-18 ENCOUNTER — Encounter: Payer: Self-pay | Admitting: Obstetrics and Gynecology

## 2016-07-18 DIAGNOSIS — Z9889 Other specified postprocedural states: Secondary | ICD-10-CM

## 2016-07-18 NOTE — Progress Notes (Signed)
Ms Sheila Carroll is here today for post op visit. Lap right salpingectomy d/t rupture ectopic No complaints. No bowel or bladder dysfunction Bleeding has stopped Pathology reviewed with pt  PT WDWN female in NAD VSS AF Lungs clear Heart RRR Abd soft + BS incisions well healed  A/P Post OP care  Return to work note provided Return to activities as tolerates Undecided about contraception, recommended condoms at the min Pt to follow up with her normal OB/GYN in [place]

## 2016-08-08 ENCOUNTER — Ambulatory Visit: Payer: Medicaid Other | Admitting: Obstetrics and Gynecology

## 2016-08-21 DIAGNOSIS — J4520 Mild intermittent asthma, uncomplicated: Secondary | ICD-10-CM | POA: Insufficient documentation

## 2016-08-21 DIAGNOSIS — E669 Obesity, unspecified: Secondary | ICD-10-CM | POA: Insufficient documentation

## 2016-09-27 DIAGNOSIS — G8929 Other chronic pain: Secondary | ICD-10-CM | POA: Insufficient documentation

## 2016-09-27 DIAGNOSIS — M25571 Pain in right ankle and joints of right foot: Secondary | ICD-10-CM

## 2016-10-18 DIAGNOSIS — L219 Seborrheic dermatitis, unspecified: Secondary | ICD-10-CM | POA: Insufficient documentation

## 2016-10-18 DIAGNOSIS — G5601 Carpal tunnel syndrome, right upper limb: Secondary | ICD-10-CM | POA: Insufficient documentation

## 2016-10-18 DIAGNOSIS — G44209 Tension-type headache, unspecified, not intractable: Secondary | ICD-10-CM | POA: Insufficient documentation

## 2017-01-04 DIAGNOSIS — F411 Generalized anxiety disorder: Secondary | ICD-10-CM | POA: Insufficient documentation

## 2017-05-01 IMAGING — US US OB TRANSVAGINAL
1 series · 14 of 28 positions shown · non-contrast
Comparison: None.

CLINICAL DATA: C/o vaginal bleeding and pelvic cramping that
started yesterday and got heavier yesterday evening; hx 2 prior
c-sections, LEEP

EXAM:
OBSTETRIC <14 WK US AND TRANSVAGINAL OB US
TECHNIQUE: Both transabdominal and transvaginal ultrasound examinations were
performed for complete evaluation of the gestation as well as the
maternal uterus, adnexal regions, and pelvic cul-de-sac.
Transvaginal technique was performed to assess early pregnancy.

[Series 1: us ob transvaginal · 0.18mm/px · 61 acquisitions, 14 frames shown]
[im 3/61]
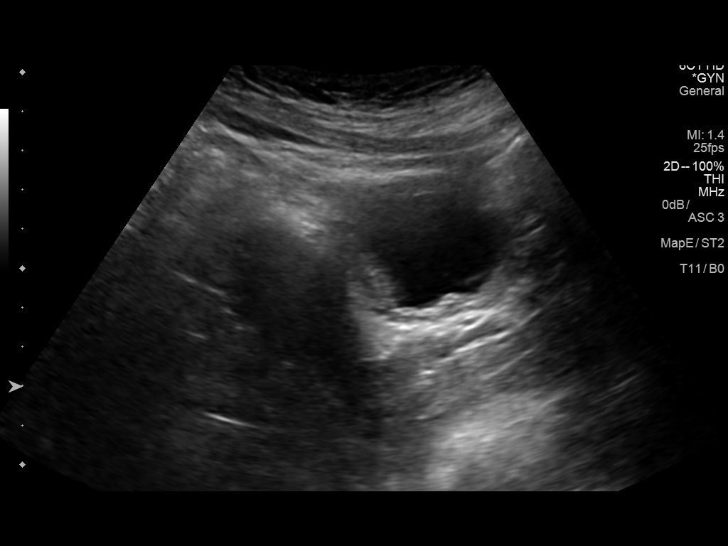
[im 7/61]
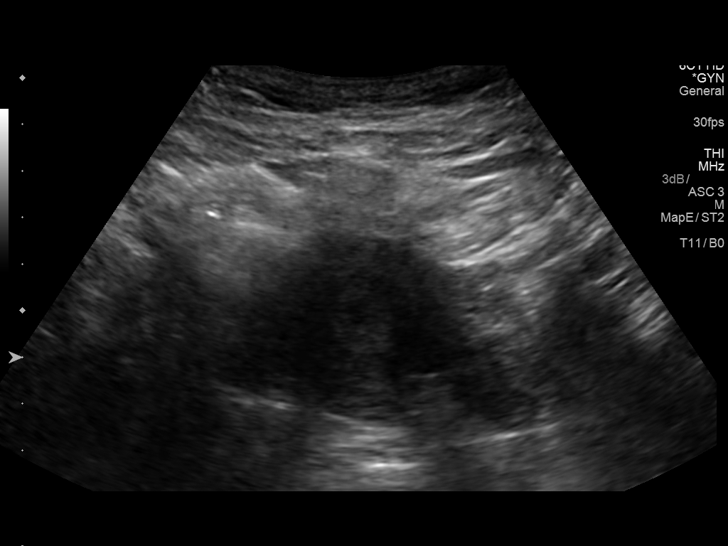
[im 12/61]
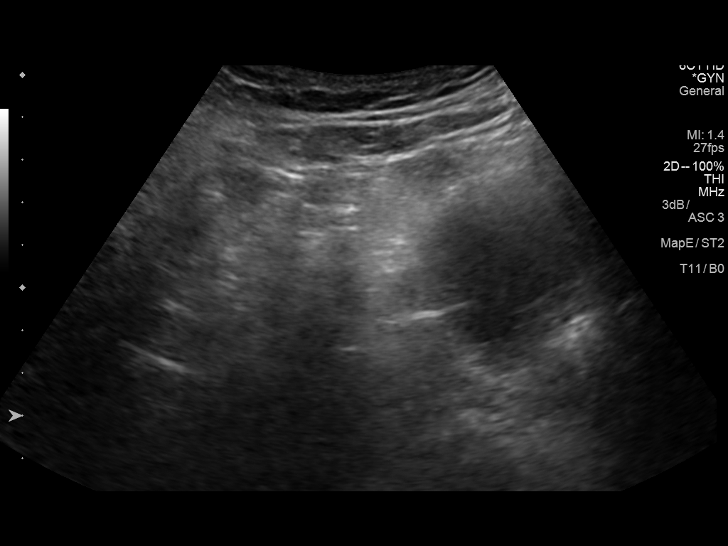
[im 16/61]
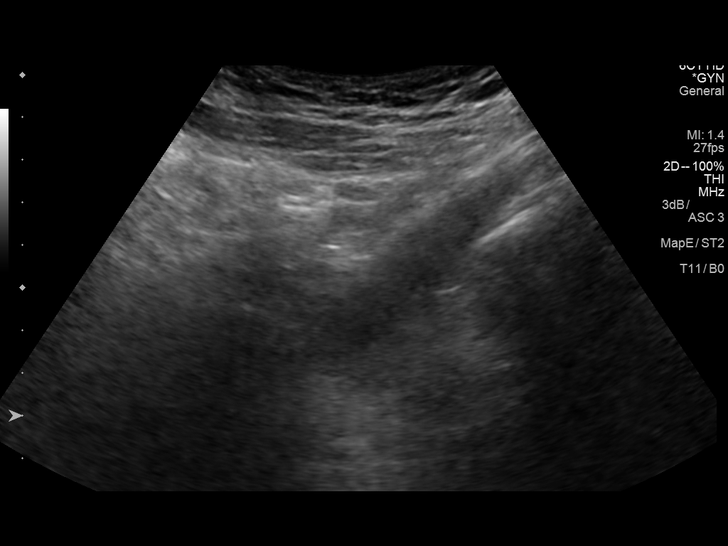
[im 21/61]
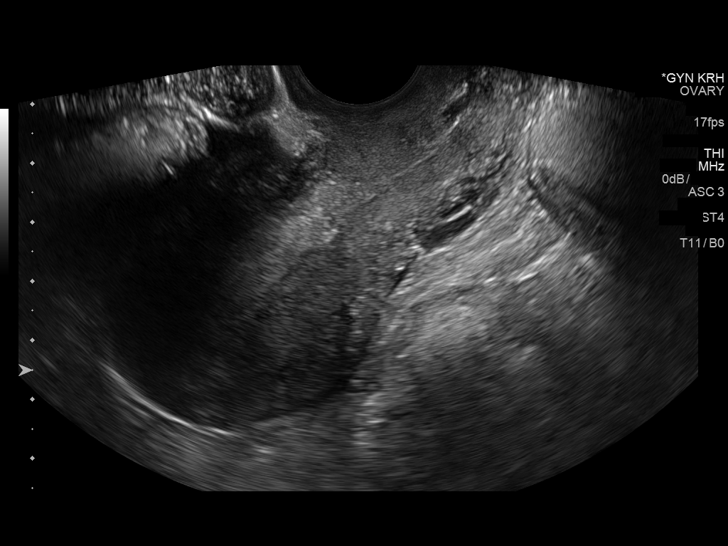
[im 25/61]
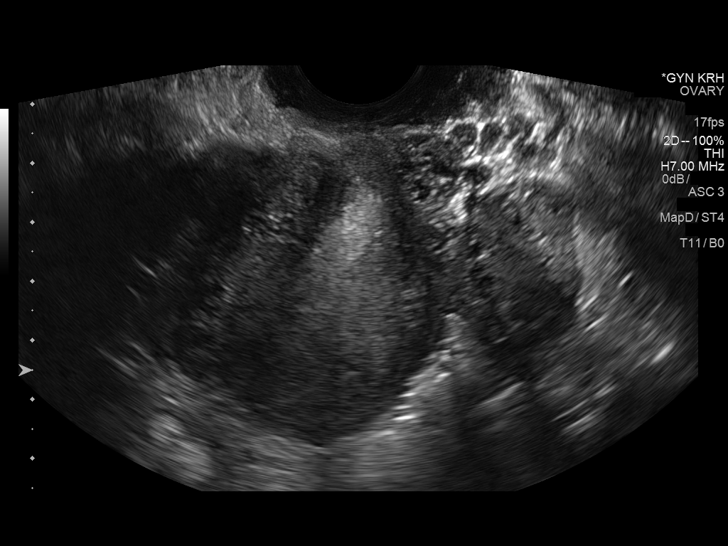
[im 29/61]
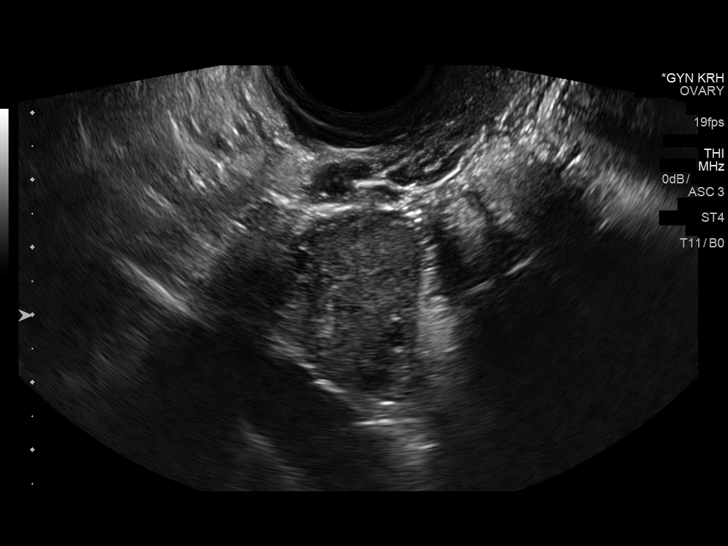
[im 34/61]
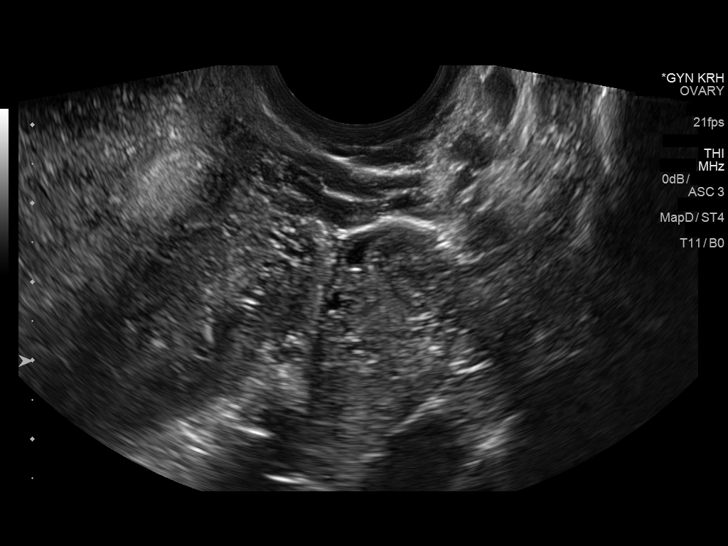
[im 38/61]
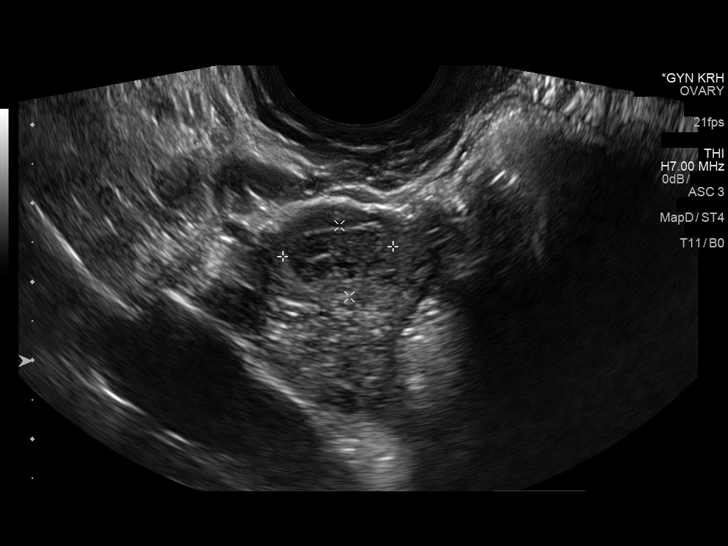
[im 43/61]
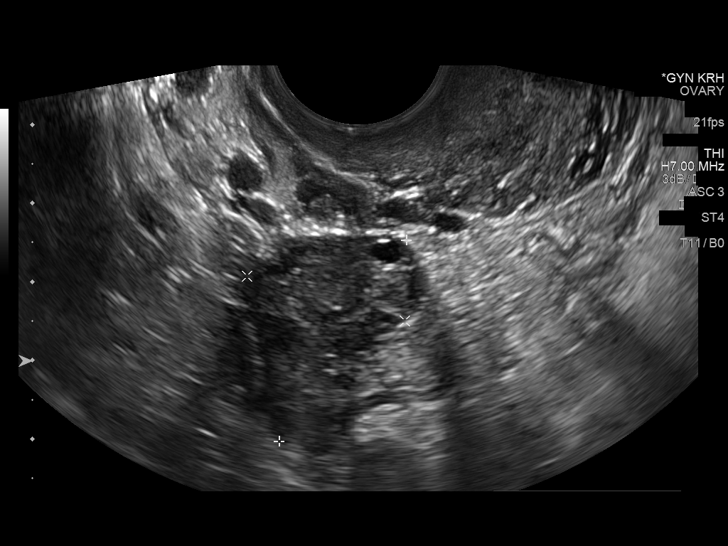
[im 47/61]
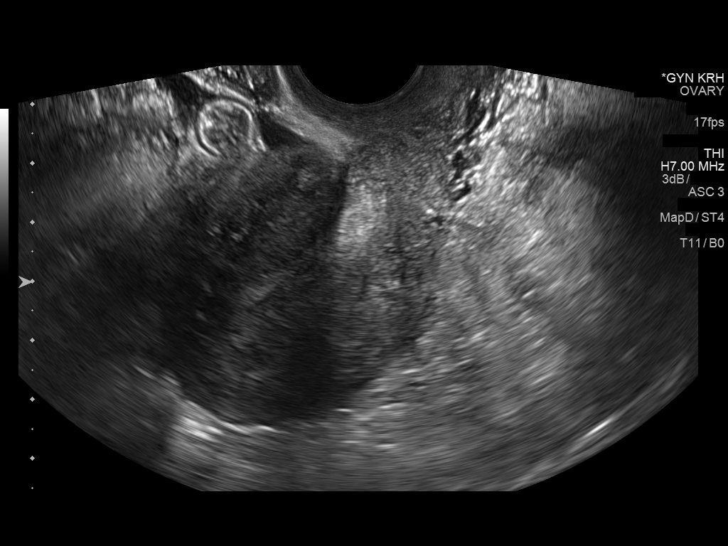
[im 52/61]
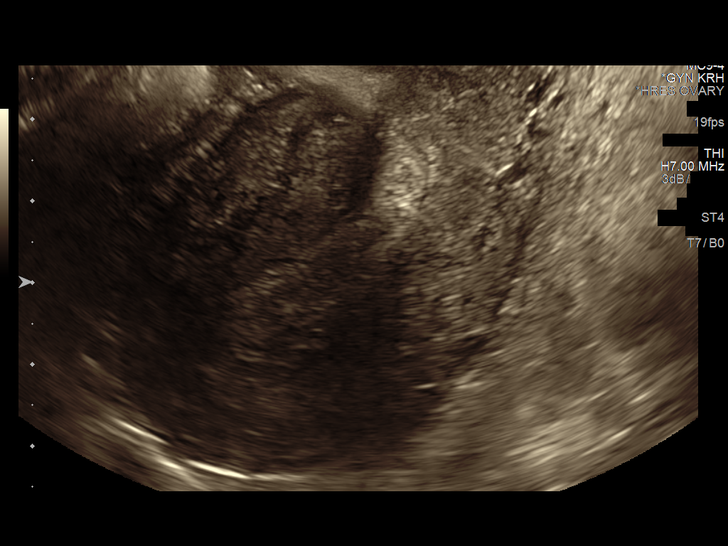
[im 56/61]
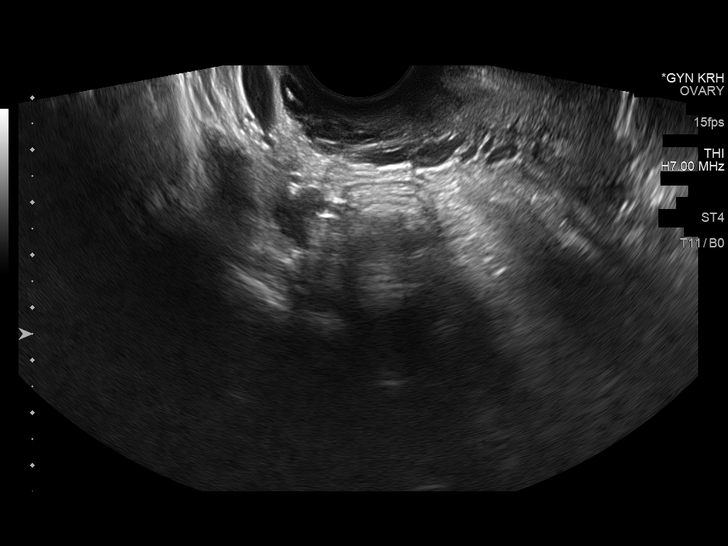
[im 61/61]
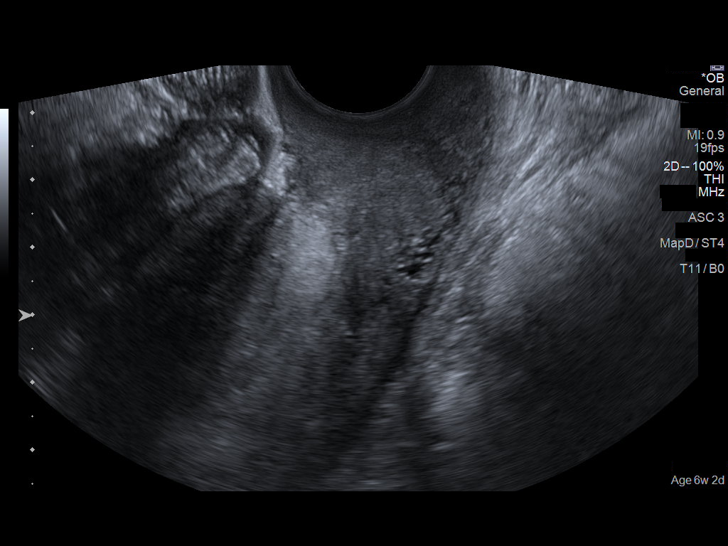

[14 of 28 positions shown; findings below may reference images not displayed]

FINDINGS: Intrauterine gestational sac: None.

Maternal uterus/adnexae: The RIGHT ovary appears normal. No uterine
mass. Myometrial echotexture is normal.

LEFT ovary demonstrates a 14 mm x 10 mm x 9 mm heterogeneous
structure within the central ovary, most compatible with corpus
luteum cyst.

Trace free fluid is present.
IMPRESSION: No intrauterine pregnancy identified in this patient with a
quantitative beta HCG of 139. Probable corpus luteum cyst in the
LEFT ovary.

## 2017-06-15 ENCOUNTER — Inpatient Hospital Stay (HOSPITAL_COMMUNITY)
Admission: AD | Admit: 2017-06-15 | Discharge: 2017-06-15 | Disposition: A | Discharge status: Home / Self Care | Payer: Medicaid Other | Source: Ambulatory Visit | Attending: Obstetrics and Gynecology | Admitting: Obstetrics and Gynecology

## 2017-06-15 ENCOUNTER — Inpatient Hospital Stay (HOSPITAL_COMMUNITY): Payer: Medicaid Other

## 2017-06-15 ENCOUNTER — Encounter (HOSPITAL_COMMUNITY): Payer: Self-pay

## 2017-06-15 DIAGNOSIS — O009 Unspecified ectopic pregnancy without intrauterine pregnancy: Secondary | ICD-10-CM | POA: Diagnosis not present

## 2017-06-15 DIAGNOSIS — O26891 Other specified pregnancy related conditions, first trimester: Secondary | ICD-10-CM | POA: Insufficient documentation

## 2017-06-15 DIAGNOSIS — Z9889 Other specified postprocedural states: Secondary | ICD-10-CM | POA: Diagnosis not present

## 2017-06-15 DIAGNOSIS — Z8619 Personal history of other infectious and parasitic diseases: Secondary | ICD-10-CM | POA: Insufficient documentation

## 2017-06-15 DIAGNOSIS — R109 Unspecified abdominal pain: Secondary | ICD-10-CM | POA: Insufficient documentation

## 2017-06-15 DIAGNOSIS — R1909 Other intra-abdominal and pelvic swelling, mass and lump: Secondary | ICD-10-CM | POA: Insufficient documentation

## 2017-06-15 DIAGNOSIS — O3481 Maternal care for other abnormalities of pelvic organs, first trimester: Secondary | ICD-10-CM | POA: Insufficient documentation

## 2017-06-15 DIAGNOSIS — Z3A01 Less than 8 weeks gestation of pregnancy: Secondary | ICD-10-CM | POA: Insufficient documentation

## 2017-06-15 DIAGNOSIS — Z87440 Personal history of urinary (tract) infections: Secondary | ICD-10-CM | POA: Diagnosis not present

## 2017-06-15 DIAGNOSIS — R102 Pelvic and perineal pain: Secondary | ICD-10-CM | POA: Diagnosis present

## 2017-06-15 HISTORY — DX: Gastro-esophageal reflux disease without esophagitis: K21.9

## 2017-06-15 HISTORY — DX: Urinary tract infection, site not specified: N39.0

## 2017-06-15 HISTORY — DX: Chlamydial infection, unspecified: A74.9

## 2017-06-15 HISTORY — DX: Essential (primary) hypertension: I10

## 2017-06-15 LAB — COMPREHENSIVE METABOLIC PANEL
ALBUMIN: 4.3 g/dL (ref 3.5–5.0)
ALT: 21 U/L (ref 14–54)
AST: 22 U/L (ref 15–41)
Alkaline Phosphatase: 115 U/L (ref 38–126)
Anion gap: 9 (ref 5–15)
BILIRUBIN TOTAL: 0.6 mg/dL (ref 0.3–1.2)
BUN: 10 mg/dL (ref 6–20)
CO2: 25 mmol/L (ref 22–32)
Calcium: 9.7 mg/dL (ref 8.9–10.3)
Chloride: 105 mmol/L (ref 101–111)
Creatinine, Ser: 0.84 mg/dL (ref 0.44–1.00)
GFR calc Af Amer: >60 mL/min (ref >60)
GFR calc non Af Amer: >60 mL/min (ref >60)
GLUCOSE: 95 mg/dL (ref 65–99)
POTASSIUM: 3.5 mmol/L (ref 3.5–5.1)
Sodium: 139 mmol/L (ref 135–145)
TOTAL PROTEIN: 7.4 g/dL (ref 6.5–8.1)

## 2017-06-15 LAB — RAPID URINE DRUG SCREEN, HOSP PERFORMED
Amphetamines: NOT DETECTED
Barbiturates: NOT DETECTED
Benzodiazepines: NOT DETECTED
Cocaine: NOT DETECTED
Opiates: NOT DETECTED
Tetrahydrocannabinol: POSITIVE — AB

## 2017-06-15 LAB — CBC
HEMATOCRIT: 37.2 % (ref 36.0–46.0)
HEMOGLOBIN: 12.5 g/dL (ref 12.0–15.0)
MCH: 29.7 pg (ref 26.0–34.0)
MCHC: 33.6 g/dL (ref 30.0–36.0)
MCV: 88.4 fL (ref 78.0–100.0)
PLATELETS: 312 10*3/uL (ref 150–400)
RBC: 4.21 MIL/uL (ref 3.87–5.11)
RDW: 13.4 % (ref 11.5–15.5)
WBC: 8.6 10*3/uL (ref 4.0–10.5)

## 2017-06-15 LAB — POCT PREGNANCY, URINE: Preg Test, Ur: POSITIVE — AB

## 2017-06-15 LAB — URINALYSIS, ROUTINE W REFLEX MICROSCOPIC
Bilirubin Urine: NEGATIVE
GLUCOSE, UA: NEGATIVE mg/dL
Hgb urine dipstick: NEGATIVE
Ketones, ur: 20 mg/dL — AB
LEUKOCYTES UA: NEGATIVE
Nitrite: NEGATIVE
PH: 7 (ref 5.0–8.0)
PROTEIN: NEGATIVE mg/dL
SPECIFIC GRAVITY, URINE: 1.011 (ref 1.005–1.030)

## 2017-06-15 LAB — WET PREP, GENITAL
Clue Cells Wet Prep HPF POC: NONE SEEN
Sperm: NONE SEEN
Trich, Wet Prep: NONE SEEN
Yeast Wet Prep HPF POC: NONE SEEN

## 2017-06-15 LAB — HCG, QUANTITATIVE, PREGNANCY: hCG, Beta Chain, Quant, S: 927 m[IU]/mL — ABNORMAL HIGH

## 2017-06-15 LAB — ABO/RH: ABO/RH(D): B POS

## 2017-06-15 MED ORDER — FENTANYL CITRATE (PF) 100 MCG/2ML IJ SOLN: 100.0000 ug | Freq: Once | INTRAMUSCULAR | Status: DC

## 2017-06-15 MED ORDER — FENTANYL CITRATE (PF) 100 MCG/2ML IJ SOLN
100.0000 ug | Freq: Once | INTRAMUSCULAR | Status: AC
  Administered 2017-06-15: 100 ug via INTRAMUSCULAR
  Filled 2017-06-15: qty 2

## 2017-06-15 MED ORDER — OXYCODONE-ACETAMINOPHEN 5-325 MG PO TABS: 1.0000 | ORAL_TABLET | Freq: Three times a day (TID) | ORAL | 0 refills | Status: DC | PRN

## 2017-06-15 MED ORDER — METHOTREXATE INJECTION FOR WOMEN'S HOSPITAL
50.0000 mg/m2 | Freq: Once | INTRAMUSCULAR | Status: AC
  Administered 2017-06-15: 105 mg via INTRAMUSCULAR
  Filled 2017-06-15: qty 2.1

## 2017-06-15 MED ORDER — OXYCODONE-ACETAMINOPHEN 5-325 MG PO TABS
1.0000 | ORAL_TABLET | Freq: Once | ORAL | Status: AC
  Administered 2017-06-15: 1 via ORAL
  Filled 2017-06-15: qty 1

## 2017-06-15 MED ORDER — ONDANSETRON HCL 8 MG PO TABS: 8.0000 mg | ORAL_TABLET | Freq: Three times a day (TID) | ORAL | 0 refills | Status: DC | PRN

## 2017-06-15 MED ORDER — ONDANSETRON 4 MG PO TBDP
4.0000 mg | ORAL_TABLET | Freq: Once | ORAL | Status: AC
  Administered 2017-06-15: 4 mg via ORAL
  Filled 2017-06-15: qty 1

## 2017-06-15 NOTE — MAU Provider Note (Signed)
History  CSN: [id number] Arrival date and time: 06/15/17 1546  First Provider Initiated Contact with Patient 06/15/17 1619      Chief Complaint  Patient presents with  . Abdominal Pain  . Pelvic Pain    HPI: Sheila Carroll is a 31 y.o. [id number] with IUP at [redacted]w[redacted]d by LMP who presents to maternity admissions reporting severe pelvic pain. She reports onset of pain on earlier today, ad has been severe. She point to suprapubic area and lower abdomen. Denies any vaginal bleeding. Also denies any fever, chills, or vomiting, but endorses nausea.   She was seen at an ED in [place] 9 days ago for epigastric pain and found to have positive pregnancy test but no IUP. She had follow up ultrasound 3 days ago which still still showed no IUP, and patient reports she has a follow up U/S scheduled. She has history of 2 SABs and had an ectopic pregnancy a year go, s/p R salpingectomy for ruptured ectopic.   OB History  Gravida Para Term Preterm AB Living  6 2 2   2 2   SAB TAB Ectopic Multiple Live Births  2       2    # Outcome Date GA Lbr Len/2nd Weight Sex Delivery Anes PTL Lv  6 Current           5 SAB           4 SAB           3 Term      CS-LTranv   LIV  2 Gravida           1 Term      CS-LTranv   LIV     Past Medical History:  Diagnosis Date  . Anxiety   . Asthma   . Chlamydia   . Depression with anxiety   . GERD (gastroesophageal reflux disease)   . Hypertension   . UTI (urinary tract infection)    Past Surgical History:  Procedure Laterality Date  . CESAREAN SECTION     x 2  . DIAGNOSTIC LAPAROSCOPY WITH REMOVAL OF ECTOPIC PREGNANCY N/A 06/27/2016   Procedure: DIAGNOSTIC LAPAROSCOPY WITH REMOVAL OF ECTOPIC PREGNANCY;  Surgeon: Catalina Antigua, MD;  Location: [facility];  Service: Gynecology;  Laterality: N/A;  . FOOT SURGERY     History reviewed. No pertinent family history. Social History   Socioeconomic History  . Marital status: Married    Spouse name: Not on file  .  Number of children: Not on file  . Years of education: Not on file  . Highest education level: Not on file  Social Needs  . Financial resource strain: Not on file  . Food insecurity - worry: Not on file  . Food insecurity - inability: Not on file  . Transportation needs - medical: Not on file  . Transportation needs - non-medical: Not on file  Occupational History  . Not on file  Tobacco Use  . Smoking status: Never Smoker  . Smokeless tobacco: Never Used  Substance and Sexual Activity  . Alcohol use: Yes    Comment: occ  . Drug use: Yes    Types: Marijuana    Comment: occasionally   . Sexual activity: Not on file  Other Topics Concern  . Not on file  Social History Narrative  . Not on file   No Known Allergies  Medications Prior to Admission  Medication Sig Dispense Refill Last Dose  . bismuth subsalicylate (PEPTO BISMOL) 262 MG/15ML  suspension Take 30 mLs by mouth every 6 (six) hours as needed.   Not Taking  . ibuprofen (ADVIL,MOTRIN) 600 MG tablet Take 1 tablet (600 mg total) by mouth every 6 (six) hours as needed. 30 tablet 1 Taking    I have reviewed patient's Past Medical Hx, Surgical Hx, Family Hx, Social Hx, medications and allergies.   Review of Systems: Negative except for what is mentioned in HPI.  Physical Exam   Blood pressure (!) 141/75, pulse 77, temperature 98.1 F (36.7 C), temperature source Oral, resp. rate 18, last menstrual period 05/16/2017, unknown if currently breastfeeding.  Constitutional: Patient moving around in bed reporting severe pain. VS wnl as above HENT: [place]/AT, normal oropharynx mucosa. MMM Eyes: normal conjunctivae, no scleral icterus Cardiovascular: normal rate, regular rhythm Respiratory: normal respiratory effort and rate GI: Abd soft, +mid-abdominal TTP. No rebound.  Pelvic: NEFG. Normal vaginal mucosa without lesions. Cervix pink, visually closed, without lesion, moderate amount of vaginal discharge, no blood seen. Diffuse  tenderness with bimanual exam. Cervix closed. Exam limited by body habitus.  MSK: Extremities nontender, no edema Neurologic: Alert and oriented x 4. Psych: Appears anxious Skin: warm and dry   MAU Course/MDM:   Nursing notes and VS reviewed. Patient seen and examined, as noted above. IV Fentanyl given prior to pelvic exam.   Cultures collected to r/o pelvic infection Labs ordered: Quant HCG, CBC, ABO/Rh, CMP Ultrasound to r/o ectopic.   Results reviewed:  Results for orders placed or performed during the hospital encounter of 06/15/17  Wet prep, genital  Result Value Ref Range   Yeast Wet Prep HPF POC NONE SEEN NONE SEEN   Trich, Wet Prep NONE SEEN NONE SEEN   Clue Cells Wet Prep HPF POC NONE SEEN NONE SEEN   WBC, Wet Prep HPF POC MANY (A) NONE SEEN   Sperm NONE SEEN   Urinalysis, Routine w reflex microscopic  Result Value Ref Range   Color, Urine YELLOW YELLOW   APPearance HAZY (A) CLEAR   Specific Gravity, Urine 1.011 1.005 - 1.030   pH 7.0 5.0 - 8.0   Glucose, UA NEGATIVE NEGATIVE mg/dL   Hgb urine dipstick NEGATIVE NEGATIVE   Bilirubin Urine NEGATIVE NEGATIVE   Ketones, ur 20 (A) NEGATIVE mg/dL   Protein, ur NEGATIVE NEGATIVE mg/dL   Nitrite NEGATIVE NEGATIVE   Leukocytes, UA NEGATIVE NEGATIVE  CBC  Result Value Ref Range   WBC 8.6 4.0 - 10.5 K/uL   RBC 4.21 3.87 - 5.11 MIL/uL   Hemoglobin 12.5 12.0 - 15.0 g/dL   HCT 33.237.2 95.136.0 - 88.446.0 %   MCV 88.4 78.0 - 100.0 fL   MCH 29.7 26.0 - 34.0 pg   MCHC 33.6 30.0 - 36.0 g/dL   RDW 16.613.4 06.311.5 - 01.615.5 %   Platelets 312 150 - 400 K/uL  hCG, quantitative, pregnancy  Result Value Ref Range   hCG, Beta Chain, Quant, S 927 (H) <5 mIU/mL  Comprehensive metabolic panel  Result Value Ref Range   Sodium 139 135 - 145 mmol/L   Potassium 3.5 3.5 - 5.1 mmol/L   Chloride 105 101 - 111 mmol/L   CO2 25 22 - 32 mmol/L   Glucose, Bld 95 65 - 99 mg/dL   BUN 10 6 - 20 mg/dL   Creatinine, Ser 0.100.84 0.44 - 1.00 mg/dL   Calcium 9.7 8.9 -  93.210.3 mg/dL   Total Protein 7.4 6.5 - 8.1 g/dL   Albumin 4.3 3.5 - 5.0 g/dL   AST  22 15 - 41 U/L   ALT 21 14 - 54 U/L   Alkaline Phosphatase 115 38 - 126 U/L   Total Bilirubin 0.6 0.3 - 1.2 mg/dL   GFR calc non Af Amer >60 >60 mL/min   GFR calc Af Amer >60 >60 mL/min   Anion gap 9 5 - 15  Urine rapid drug screen (hosp performed)  Result Value Ref Range   Opiates NONE DETECTED NONE DETECTED   Cocaine NONE DETECTED NONE DETECTED   Benzodiazepines NONE DETECTED NONE DETECTED   Amphetamines NONE DETECTED NONE DETECTED   Tetrahydrocannabinol POSITIVE (A) NONE DETECTED   Barbiturates NONE DETECTED NONE DETECTED  Pregnancy, urine POC  Result Value Ref Range   Preg Test, Ur POSITIVE (A) NEGATIVE  ABO/Rh  Result Value Ref Range   ABO/RH(D) B POS    US OB Transvaginal CLINICAL DATA:  Lower abdominal pain beginning today. Unsure of LMP. Positive pregnancy test. Previous history of ectopic pregnancies and miscarriages.  EXAM: OBSTETRIC <14 WK US AND TRANSVAGINAL OB US  TECHNIQUE: Both transabdominal and transvaginal ultrasound examinations were performed for complete evaluation of the gestation as well as the maternal uterus, adnexal regions, and pelvic cul-de-sac. Transvaginal technique was performed to assess early pregnancy.  COMPARISON:  06/12/2017  FINDINGS: Intrauterine gestational sac: None  Maternal uterus/adnexae: No uterine fibroids identified. Both ovaries are normal in appearance. A heterogeneous mass is seen in the right adnexa abutting the uterine fundus and separate from the right ovary. This measures 2.7 x 1.8 x 1.7 cm. A small amount of echogenic free fluid is seen within the pelvic cul-de-sac, consistent with mild hemoperitoneum.  IMPRESSION: No IUP. 2.7 cm right adnexal mass, highly suspicious for ectopic pregnancy.  Mild hemoperitoneum in pelvic cul-de-sac.  Critical Value/emergent results were called by telephone at the time of interpretation on  06/15/2017 at 5:55 pm to Dr. Nolene EbbsJULIE DEGELE , who verbally acknowledged these results.  Electronically Signed   By: Myles RosenthalJohn  Stahl M.D.   On: 06/15/2017 17:57   --Discussed results of ectopic pregnancy, and management options (medical vs surgical). Her pain has improved significantly since arrival.  --Consulted with Dr. Alysia PennaErvin and he also discussed management options with patient. Patient elected for medical therapy.  --Methotrexate 50 mg/m2 IM ordred  Assessment and Plan  Assessment: 1. Ectopic pregnancy without intrauterine pregnancy, unspecified location   2. Abdominal pain during pregnancy in first trimester     Plan: --Discharge home in stable condition s/p methotrexate  --Patient counseled on refraining from sexual  intercourse and strenuous exercise, and avoiding all NSAIDs --Counseled on needed follow up in 3 days then again in 1 week, possible need of additional follow up. Will follow up on Day #4 (Monday) outpatient (at [facility] at 11:00am for quant b-hCG. --Counseled extensively on return precautions, including uncontrolled pain, lightheadedness, dizziness, fever.  Degele, Kandra NicolasJulie P, MD 06/15/2017 7:37 PM  Pt seen and examined. No acute surgerical abd on exam. Tx options reviewed with pt. ( Medical and Surgerical) Pt elected to proceed with MTHX. Agree with treatment plan as outlined in note above.  Nettie ElmMichael Tamre Cass, MD

## 2017-06-15 NOTE — MAU Note (Addendum)
Pt said she has been having abdominal and pelvic pain today for about an hour. No bleeding. Was seen in an ER last week for GERD and found out she was pregnant. Says she has HTN and anxiety and was told to stop all her meds. Father passed away two days ago and is having a lot of anxiety.

## 2017-06-15 NOTE — Discharge Instructions (Signed)
Ectopic Pregnancy °An ectopic pregnancy happens when a fertilized egg grows outside the uterus. A pregnancy cannot live outside of the uterus. This problem often happens in the fallopian tube. It is often caused by damage to the fallopian tube. °If this problem is found early, you may be treated with medicine. If your tube tears or bursts open (ruptures), you will bleed inside. This is an emergency. You will need surgery. Get help right away. °What are the signs or symptoms? °You may have normal pregnancy symptoms at first. These include: °· Missing your period. °· Feeling sick to your stomach (nauseous). °· Being tired. °· Having tender breasts. ° °Then, you may start to have symptoms that are not normal. These include: °· Pain with sex (intercourse). °· Bleeding from the vagina. This includes light bleeding (spotting). °· Belly (abdomen) or lower belly cramping or pain. This may be felt on one side. °· A fast heartbeat (pulse). °· Passing out (fainting) after going poop (bowel movement). ° °If your tube tears, you may have symptoms such as: °· Really bad pain in the belly or lower belly. This happens suddenly. °· Dizziness. °· Passing out. °· Shoulder pain. ° °Get help right away if: °You have any of these symptoms. This is an emergency. °This information is not intended to replace advice given to you by your health care provider. Make sure you discuss any questions you have with your health care provider. °Document Released: 09/29/2008 Document Revised: 12/09/2015 Document Reviewed: 02/12/2013 °Elsevier Interactive Patient Education © 2017 [employer] ° °

## 2017-06-18 ENCOUNTER — Ambulatory Visit (INDEPENDENT_AMBULATORY_CARE_PROVIDER_SITE_OTHER): Payer: Medicaid Other | Admitting: Clinical

## 2017-06-18 ENCOUNTER — Ambulatory Visit: Payer: Medicaid Other | Admitting: General Practice

## 2017-06-18 DIAGNOSIS — F4321 Adjustment disorder with depressed mood: Secondary | ICD-10-CM | POA: Diagnosis not present

## 2017-06-18 DIAGNOSIS — O00109 Unspecified tubal pregnancy without intrauterine pregnancy: Secondary | ICD-10-CM

## 2017-06-18 LAB — HCG, QUANTITATIVE, PREGNANCY: hCG, Beta Chain, Quant, S: 1245 m[IU]/mL — ABNORMAL HIGH (ref <5)

## 2017-06-18 NOTE — Progress Notes (Signed)
Patient here for stat bhcg today for day #4 labs following MTX. Patient reports continued pain, rates a 6-7 but has improved. Patient denies bleeding. Discussed with patient having her wait in lobby for results and updated plan of care. Patient verbalized understanding & had no questions at this time. Reviewed patient's bhcg levels with Dr Adrian BlackwaterStinson who notes slight rise in bhcg levels, but will recheck on Thursday for day #7 labs.  Informed patient of results & updated plan of care. Patient verbalized understanding & had no questions

## 2017-06-18 NOTE — BH Specialist Note (Signed)
Integrated Behavioral Health Initial Visit  MRN: [id number] Name: Sheila Carroll  Number of Integrated Behavioral Health Clinician visits:: 1/6 Session Start time: 12:20  Session End time: 1:40 Total time: 1 hour  Type of Service: Integrated Behavioral Health- Individual/Family Interpretor:No. Interpretor Name and Language: n/a   Warm Hand Off Completed.       SUBJECTIVE: Sheila Carroll is a 31 y.o. female accompanied by n/a Patient was referred by Dr Marice Potterove for grief. Patient reports the following symptoms/concerns: Pt states her primary concern today is grief over losing her father. Pt found out about ectopic pregnancy on Friday and attended her fathers' funeral on Saturday; recent losses triggered additional grief of previous ectopic pregnancy, along with two SAB.  Duration of problem: Less than one week; Severity of problem: severe  OBJECTIVE: Mood: Depressed and Affect: Depressed and Tearful Risk of harm to self or others: No plan to harm self or others  LIFE CONTEXT: Family and Social: Lives with husband and two children(10yo and 4yo boys); adult client experiencing disability lives in her home as well. 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 Reduce symptoms of: depression 2. Increase knowledge and/or ability of: coping skills  3. Demonstrate ability to: Increase healthy adjustment to current life circumstances, Increase adequate support systems for patient/family and Begin healthy grieving over loss  INTERVENTIONS: Interventions utilized: Mindfulness or Management consultantelaxation Training, Supportive Counseling and Psychoeducation and/or Health Education  Standardized Assessments completed: Patient declined screening  ASSESSMENT: Patient currently experiencing Grief.   Patient may benefit from  psychoeducation and brief therapeutic interventions regarding coping with symptoms of anxiety and depression, related to recent losses.  PLAN: 1. Follow up with behavioral health clinician on : Brief f/u at next medical visit 2. Behavioral recommendations:  -Consider hospice group grief counseling after one month -Consider heartstrings grief support for families -Consider CALM relaxation breathing exercise and sleep app for additional daily coping  -Read educational material regarding symptoms of anxiety and depression related to loss 3. Referral(s): [facility] 4. "From scale of 1-10, how likely are you to follow plan?": 8  Jamie C McMannes, LCSW

## 2017-06-19 LAB — GC/CHLAMYDIA PROBE AMP (~~LOC~~) NOT AT ARMC
Chlamydia: NEGATIVE
Neisseria Gonorrhea: NEGATIVE

## 2017-06-21 ENCOUNTER — Ambulatory Visit: Payer: Self-pay

## 2017-06-25 ENCOUNTER — Encounter (HOSPITAL_BASED_OUTPATIENT_CLINIC_OR_DEPARTMENT_OTHER): Payer: Self-pay | Admitting: Emergency Medicine

## 2017-06-25 ENCOUNTER — Other Ambulatory Visit: Payer: Self-pay

## 2017-06-25 ENCOUNTER — Emergency Department (HOSPITAL_BASED_OUTPATIENT_CLINIC_OR_DEPARTMENT_OTHER): Payer: Medicaid Other

## 2017-06-25 ENCOUNTER — Emergency Department (HOSPITAL_BASED_OUTPATIENT_CLINIC_OR_DEPARTMENT_OTHER)
Admission: EM | Admit: 2017-06-25 | Discharge: 2017-06-25 | Disposition: A | Discharge status: Home / Self Care | Payer: Medicaid Other | Attending: Emergency Medicine | Admitting: Emergency Medicine

## 2017-06-25 DIAGNOSIS — I10 Essential (primary) hypertension: Secondary | ICD-10-CM | POA: Insufficient documentation

## 2017-06-25 DIAGNOSIS — Z3200 Encounter for pregnancy test, result unknown: Secondary | ICD-10-CM | POA: Diagnosis present

## 2017-06-25 DIAGNOSIS — R1032 Left lower quadrant pain: Secondary | ICD-10-CM | POA: Diagnosis not present

## 2017-06-25 DIAGNOSIS — O00101 Right tubal pregnancy without intrauterine pregnancy: Secondary | ICD-10-CM | POA: Diagnosis not present

## 2017-06-25 DIAGNOSIS — F1210 Cannabis abuse, uncomplicated: Secondary | ICD-10-CM | POA: Diagnosis not present

## 2017-06-25 DIAGNOSIS — J45909 Unspecified asthma, uncomplicated: Secondary | ICD-10-CM | POA: Diagnosis not present

## 2017-06-25 LAB — HCG, QUANTITATIVE, PREGNANCY: HCG, BETA CHAIN, QUANT, S: 149 m[IU]/mL — AB (ref <5)

## 2017-06-25 NOTE — ED Provider Notes (Signed)
[facility] EMERGENCY DEPARTMENT Provider Note   CSN: [id number] Arrival date & time: 06/25/17  1435     History   Chief Complaint Chief Complaint  Patient presents with  . Possible Pregnancy    HPI Sheila Carroll is a 31 y.o. female.  Patient is a 31 year old female with prior history of left-sided ectopic pregnancy status post oophorectomy, G6 P2 A2, about [redacted] weeks pregnant treated 11 days ago with methotrexate for her known ectopic pregnancy presenting today because she is not having any vaginal bleeding.  Patient was seen 4 days ago and at that time was found to have downward trending hCGs at 837.  Patient states the pain in her left side is significantly better but still present when moving but she states is occasionally a sharp pain but feels more like a pressure.  It does not radiate anywhere.  She denies any urinary symptoms and has had no vaginal discharge.  No nausea vomiting or fever present.  She spoke with the OB/GYN and because of the weather was not sure she could make it to [facility] so they recommended she come here for evaluation.    Possible Pregnancy     Past Medical History:  Diagnosis Date  . Anxiety   . Asthma   . Chlamydia   . Depression with anxiety   . GERD (gastroesophageal reflux disease)   . Hypertension   . UTI (urinary tract infection)     Patient Active Problem List   Diagnosis Date Noted  . Post-operative state 07/18/2016    Past Surgical History:  Procedure Laterality Date  . CESAREAN SECTION     x 2  . DIAGNOSTIC LAPAROSCOPY WITH REMOVAL OF ECTOPIC PREGNANCY N/A 06/27/2016   Procedure: DIAGNOSTIC LAPAROSCOPY WITH REMOVAL OF ECTOPIC PREGNANCY;  Surgeon: Catalina Antigua, MD;  Location: [facility];  Service: Gynecology;  Laterality: N/A;  . FOOT SURGERY      OB History    Gravida Para Term Preterm AB Living   6 2 2   3 2    SAB TAB Ectopic Multiple Live Births   2   1   2        Home Medications    Prior to  Admission medications   Medication Sig Start Date End Date Taking? Authorizing Provider  bismuth subsalicylate (PEPTO BISMOL) 262 MG/15ML suspension Take 30 mLs by mouth every 6 (six) hours as needed.    [provider]  ondansetron (ZOFRAN) 8 MG tablet Take 1 tablet (8 mg total) by mouth every 8 (eight) hours as needed for nausea or vomiting. 06/15/17 06/15/18  Degele, Kandra Nicolas, MD  oxyCODONE-acetaminophen (ROXICET) 5-325 MG tablet Take 1 tablet by mouth every 8 (eight) hours as needed for severe pain. 06/15/17   Degele, Kandra Nicolas, MD    Family History History reviewed. No pertinent family history.  Social History Social History   Tobacco Use  . Smoking status: Never Smoker  . Smokeless tobacco: Never Used  Substance Use Topics  . Alcohol use: Yes    Comment: occ  . Drug use: Yes    Types: Marijuana    Comment: occasionally      Allergies   Ramipril   Review of Systems Review of Systems  All other systems reviewed and are negative.    Physical Exam Updated Vital Signs BP (!) 149/85 (BP Location: Left Arm)   Pulse 84   Temp 98.4 F (36.9 C) (Oral)   Resp 18   Ht 5\' 2"  (1.575  m)   Wt 98.9 kg (218 lb)   LMP 05/16/2017   SpO2 100%   BMI 39.87 kg/m   Physical Exam  Constitutional: She is oriented to person, place, and time. She appears well-developed and well-nourished. No distress.  HENT:  Head: Normocephalic and atraumatic.  Eyes: EOM are normal. Pupils are equal, round, and reactive to light.  Cardiovascular: Normal rate.  Pulmonary/Chest: Effort normal.  Abdominal: Soft. She exhibits no mass. There is tenderness in the right lower quadrant. There is no guarding.    Musculoskeletal: She exhibits no tenderness.  Neurological: She is alert and oriented to person, place, and time.  Skin: Skin is warm and dry. Capillary refill takes less than 2 seconds.  Psychiatric: She has a normal mood and affect. Her behavior is normal.  Nursing note and vitals  reviewed.    ED Treatments / Results  Labs (all labs ordered are listed, but only abnormal results are displayed) Labs Reviewed  HCG, QUANTITATIVE, PREGNANCY - Abnormal; Notable for the following components:      Result Value   hCG, Beta Chain, Quant, S 149 (*)    All other components within normal limits    EKG  EKG Interpretation None       Radiology [place] Ob Transvaginal  Result Date: 06/25/2017 CLINICAL DATA:  Pelvic pain EXAM: TRANSVAGINAL OB ULTRASOUND TECHNIQUE: Transvaginal ultrasound was performed for complete evaluation of the gestation as well as the maternal uterus, adnexal regions, and pelvic cul-de-sac. COMPARISON:  06/15/2017 FINDINGS: Intrauterine gestational sac: None Subchorionic hemorrhage:  None Maternal uterus/adnexae: The left ovary demonstrates a large complex cystic lesion which measures 5 cm in greatest dimension. This occupies a previously seen simple cyst noted on the prior exam. This would be consistent with a hemorrhagic cyst. The right ovary is again well visualized and within normal limits. Adjacent to the right ovary there is again noted a 2.7 cm complex area previously noted to be suspicious for ectopic pregnancy. Patient has undergone methotrexate therapy. Mild free pelvic fluid is noted decreased in the interval from the prior exam. IMPRESSION: Changes consistent with a hemorrhagic cyst in the left ovary new from the prior exam. Persistent right adnexal mass as previously seen. The amount of free pelvic fluid is decreased in the interval. Electronically Signed   By: Alcide CleverMark  Lukens M.D.   On: 06/25/2017 16:28    Procedures Procedures (including critical care time)  Medications Ordered in ED Medications - No data to display   Initial Impression / Assessment and Plan / ED Course  I have reviewed the triage vital signs and the nursing notes.  Pertinent labs & imaging results that were available during my care of the patient were reviewed by me and  considered in my medical decision making (see chart for details).     Patient presenting with known right-sided ectopic pregnancy treated 11 days ago with methotrexate who has not had any vaginal bleeding.  Pain is improving but not gone.  Last hCG was 4 days ago at that time it was 837.  Will repeat hCG and ultrasound today.  5:00 PM Ultrasound with improved pelvic fluid.  Ectopic is still present.  HCG is significantly improved at 150.  Re[facility] the [facility] tomorrow to find out when they want another hCG.  Final Clinical Impressions(s) / ED Diagnoses   Final diagnoses:  Right tubal pregnancy without intrauterine pregnancy    ED Discharge Orders    None  Gwyneth SproutPlunkett, Ala Kratz, MD 06/25/17 [phone]

## 2017-06-25 NOTE — ED Triage Notes (Signed)
Patient states that she was seen and treated for a possible ectopic pregnancy. The patient reports that she was given methotrexate. The patient reports that she has been to 2 more faciliites to have her "levels" checked and they are "going down" - the patient states that she has not started to bleed and she is concerned

## 2017-07-24 DIAGNOSIS — M545 Low back pain, unspecified: Secondary | ICD-10-CM | POA: Insufficient documentation

## 2017-08-06 DIAGNOSIS — Z8759 Personal history of other complications of pregnancy, childbirth and the puerperium: Secondary | ICD-10-CM | POA: Insufficient documentation

## 2017-08-30 DIAGNOSIS — F4329 Adjustment disorder with other symptoms: Secondary | ICD-10-CM | POA: Insufficient documentation

## 2017-11-20 IMAGING — US US OB TRANSVAGINAL
1 series · 15 of 28 positions shown · non-contrast
Comparison: 06/23/2016.

CLINICAL DATA: Abdominal pain.  Pregnancy.

EXAM:
TRANSVAGINAL OB ULTRASOUND
TECHNIQUE: Transvaginal ultrasound was performed for complete evaluation of the
gestation as well as the maternal uterus, adnexal regions, and
pelvic cul-de-sac.

[Series 1: us ob transvaginal · 15 of 42 slices shown]
[im 1/42]
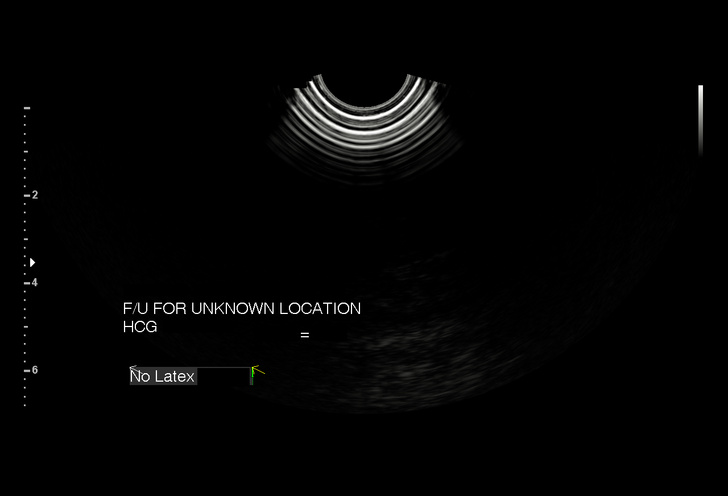
[im 4/42]
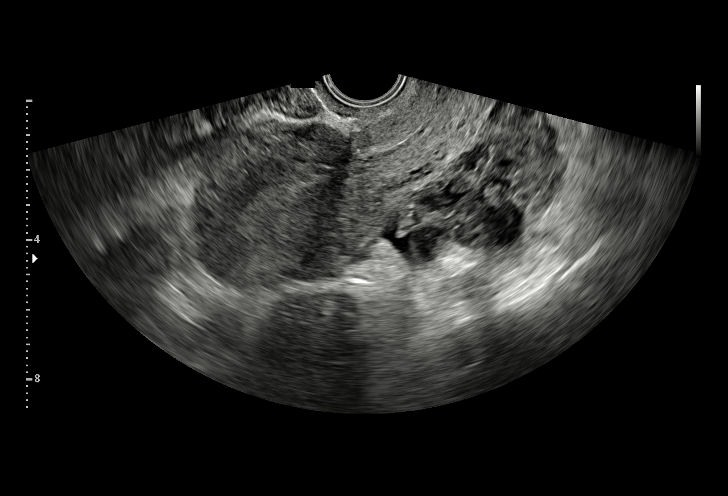
[im 7/42]
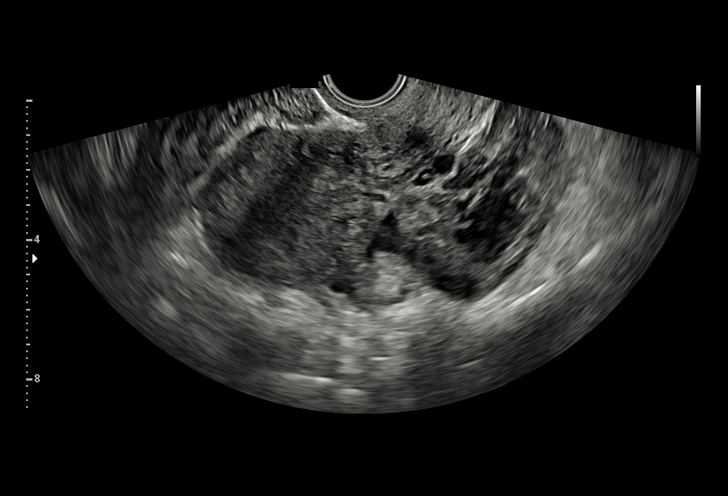
[im 10/42]
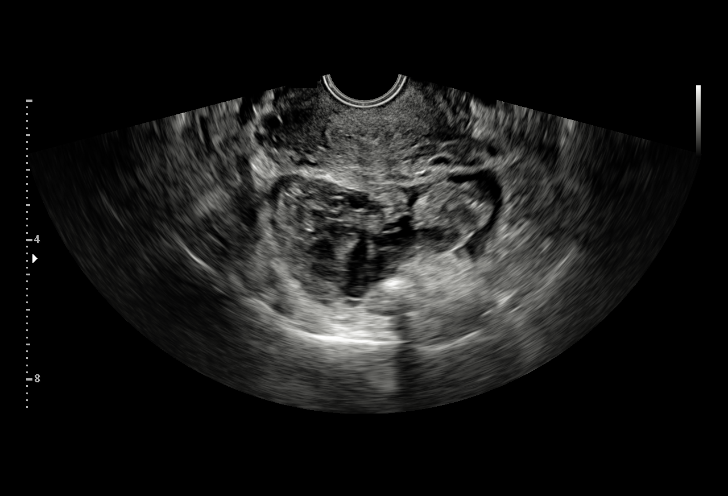
[im 13/42]
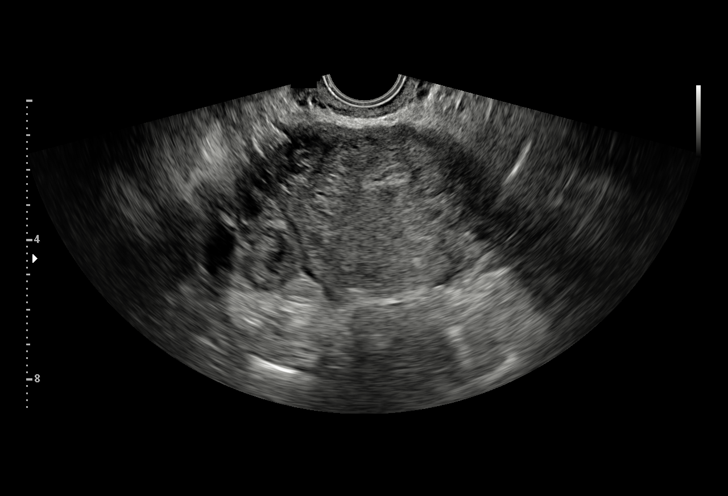
[im 16/42]
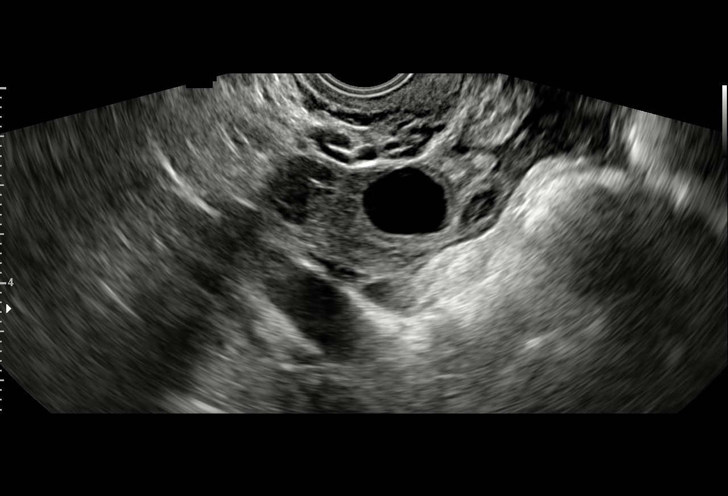
[im 19/42]
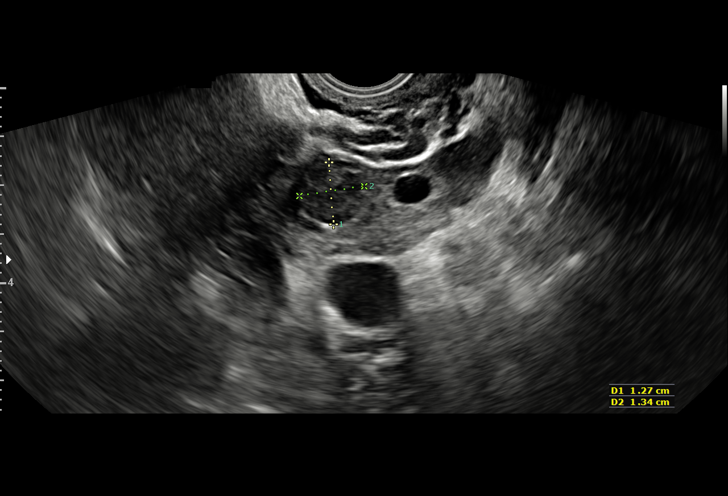
[im 22/42]
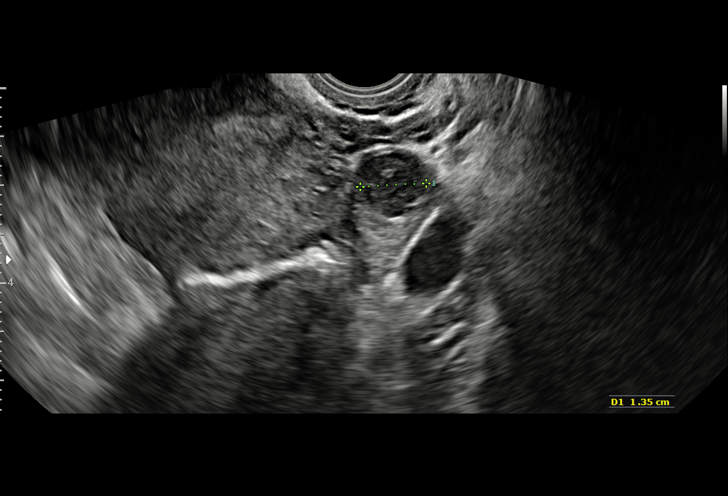
[im 23/42]
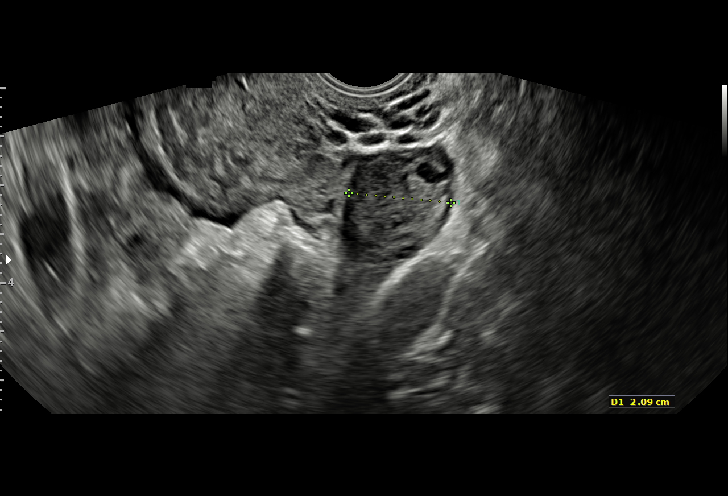
[im 26/42]
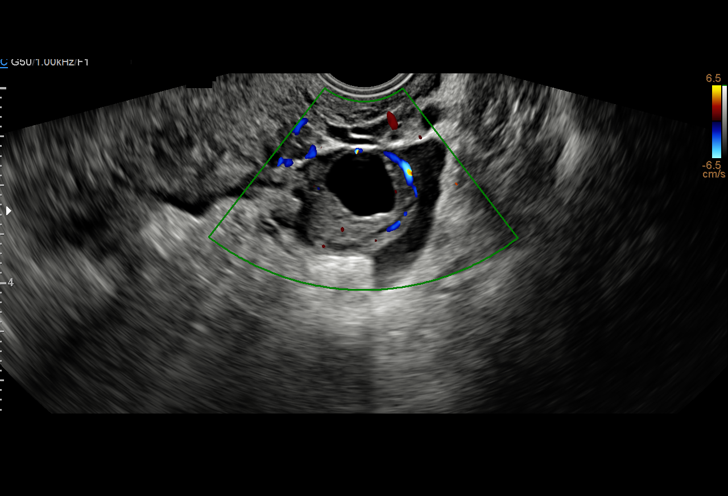
[im 29/42]
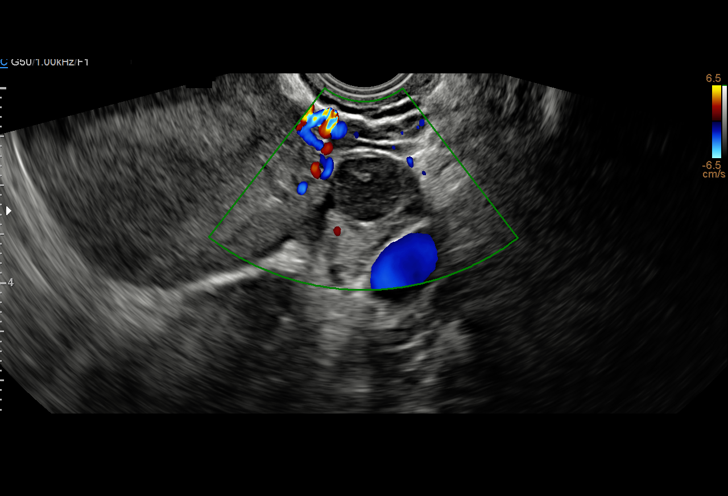
[im 32/42]
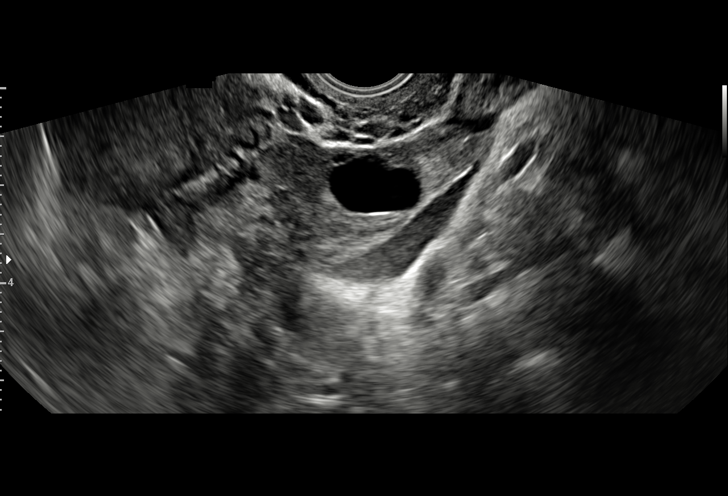
[im 35/42]
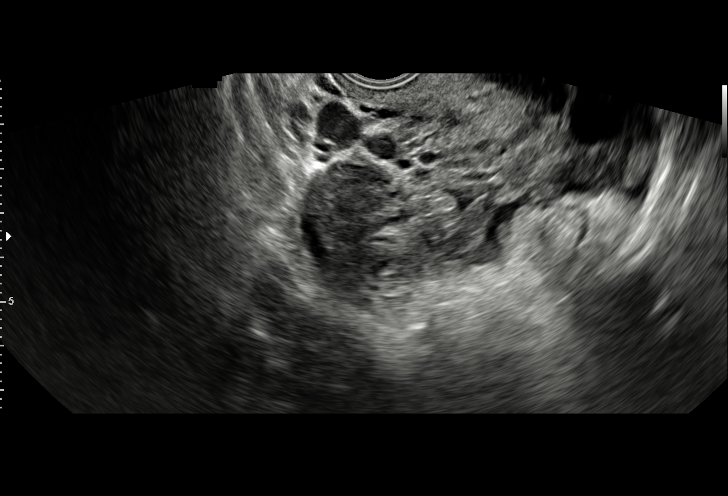
[im 38/42]
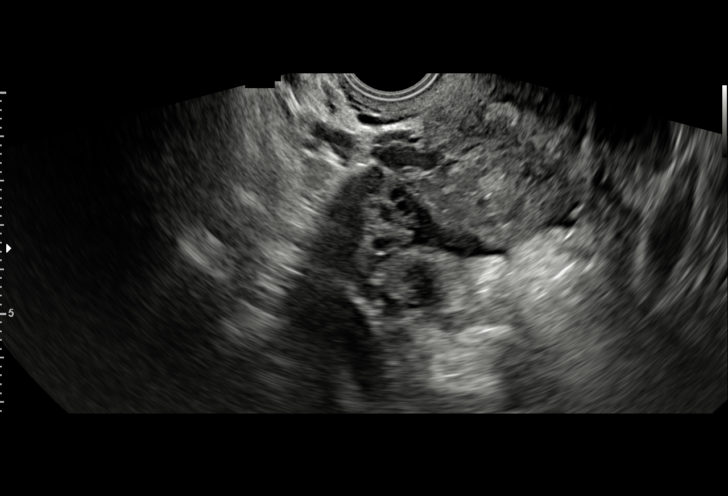
[im 42/42]
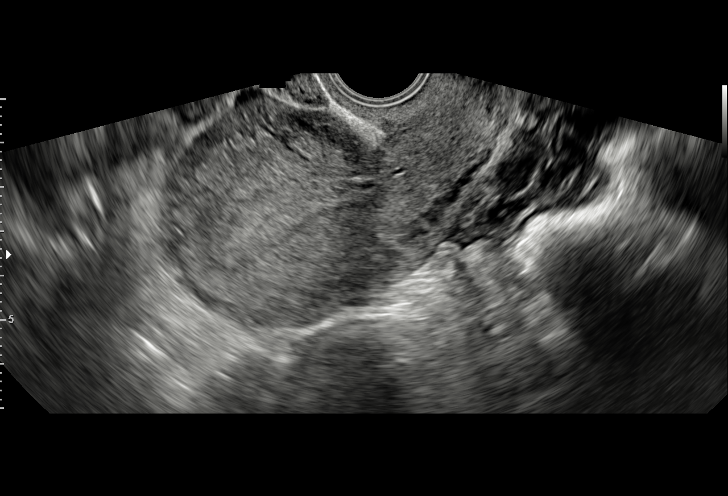

[15 of 28 positions shown; findings below may reference images not displayed]

FINDINGS: Intrauterine gestational sac: None visualized

Yolk sac:  None visualized

Embryo:  None visualized

Cardiac Activity: None visualized

Subchorionic hemorrhage:  None visualized.

Maternal uterus/adnexae: 1.3 cm complex cyst left ovary adjacent
cm simple cyst.

In the cul de sac is a large amount of echogenic material and
adjacent fluid. This is suspicious for hemorrhage. Ectopic pregnancy
cannot be excluded.
IMPRESSION: No intrauterine gestational noted. A 1.3 cm complex cyst is in the
left ovary. Large amount of echogenic material is noted in the
pelvis consistent with clot. Adjacent fluid noted. Ectopic pregnancy
cannot be excluded.

Critical Value/emergent results were called by telephone at the time
of interpretation on 06/27/2016 at [DATE] to Dr. IDAET RASI ,
who verbally acknowledged these results.

## 2018-03-19 ENCOUNTER — Emergency Department (HOSPITAL_BASED_OUTPATIENT_CLINIC_OR_DEPARTMENT_OTHER)
Admission: EM | Admit: 2018-03-19 | Discharge: 2018-03-19 | Disposition: A | Discharge status: Home / Self Care | Payer: Medicaid Other | Attending: Emergency Medicine | Admitting: Emergency Medicine

## 2018-03-19 ENCOUNTER — Other Ambulatory Visit: Payer: Self-pay

## 2018-03-19 ENCOUNTER — Emergency Department (HOSPITAL_BASED_OUTPATIENT_CLINIC_OR_DEPARTMENT_OTHER): Payer: Medicaid Other

## 2018-03-19 ENCOUNTER — Encounter (HOSPITAL_BASED_OUTPATIENT_CLINIC_OR_DEPARTMENT_OTHER): Payer: Self-pay | Admitting: *Deleted

## 2018-03-19 DIAGNOSIS — Z79899 Other long term (current) drug therapy: Secondary | ICD-10-CM | POA: Insufficient documentation

## 2018-03-19 DIAGNOSIS — O99511 Diseases of the respiratory system complicating pregnancy, first trimester: Secondary | ICD-10-CM | POA: Diagnosis not present

## 2018-03-19 DIAGNOSIS — O10011 Pre-existing essential hypertension complicating pregnancy, first trimester: Secondary | ICD-10-CM | POA: Diagnosis not present

## 2018-03-19 DIAGNOSIS — O2311 Infections of bladder in pregnancy, first trimester: Secondary | ICD-10-CM | POA: Diagnosis not present

## 2018-03-19 DIAGNOSIS — J45909 Unspecified asthma, uncomplicated: Secondary | ICD-10-CM | POA: Insufficient documentation

## 2018-03-19 DIAGNOSIS — Z3A01 Less than 8 weeks gestation of pregnancy: Secondary | ICD-10-CM | POA: Diagnosis not present

## 2018-03-19 DIAGNOSIS — N3000 Acute cystitis without hematuria: Secondary | ICD-10-CM

## 2018-03-19 LAB — URINALYSIS, ROUTINE W REFLEX MICROSCOPIC
Bilirubin Urine: NEGATIVE
GLUCOSE, UA: NEGATIVE mg/dL
KETONES UR: NEGATIVE mg/dL
Nitrite: NEGATIVE
PH: 6.5 (ref 5.0–8.0)
Protein, ur: NEGATIVE mg/dL
SPECIFIC GRAVITY, URINE: 1.02 (ref 1.005–1.030)

## 2018-03-19 LAB — CBC
HCT: 35.4 % — ABNORMAL LOW (ref 36.0–46.0)
Hemoglobin: 11.9 g/dL — ABNORMAL LOW (ref 12.0–15.0)
MCH: 28.3 pg (ref 26.0–34.0)
MCHC: 33.6 g/dL (ref 30.0–36.0)
MCV: 84.3 fL (ref 78.0–100.0)
PLATELETS: 292 10*3/uL (ref 150–400)
RBC: 4.2 MIL/uL (ref 3.87–5.11)
RDW: 13.7 % (ref 11.5–15.5)
WBC: 8.9 10*3/uL (ref 4.0–10.5)

## 2018-03-19 LAB — BASIC METABOLIC PANEL
ANION GAP: 8 (ref 5–15)
BUN: 10 mg/dL (ref 6–20)
CALCIUM: 9 mg/dL (ref 8.9–10.3)
CO2: 26 mmol/L (ref 22–32)
Chloride: 104 mmol/L (ref 98–111)
Creatinine, Ser: 0.79 mg/dL (ref 0.44–1.00)
GLUCOSE: 150 mg/dL — AB (ref 70–99)
Potassium: 3.5 mmol/L (ref 3.5–5.1)
SODIUM: 138 mmol/L (ref 135–145)

## 2018-03-19 LAB — HCG, QUANTITATIVE, PREGNANCY: HCG, BETA CHAIN, QUANT, S: 263 m[IU]/mL — AB (ref <5)

## 2018-03-19 LAB — URINALYSIS, MICROSCOPIC (REFLEX)

## 2018-03-19 LAB — PREGNANCY, URINE: Preg Test, Ur: POSITIVE — AB

## 2018-03-19 MED ORDER — CEPHALEXIN 500 MG PO CAPS: 500.0000 mg | ORAL_CAPSULE | Freq: Four times a day (QID) | ORAL | 0 refills | Status: DC

## 2018-03-19 MED ORDER — PRENATAL VITAMINS 28-0.8 MG PO TABS: 1.0000 | ORAL_TABLET | Freq: Every day | ORAL | 1 refills | Status: DC

## 2018-03-19 NOTE — ED Notes (Signed)
ED Provider at bedside. 

## 2018-03-19 NOTE — ED Notes (Signed)
Patient transported to Ultrasound 

## 2018-03-19 NOTE — Discharge Instructions (Addendum)
As we discussed ectopic pregnancy is not ruled out.  And you are a significant risk for that.  Follow-up with [facility] clinic for repeat pregnancy hormone number or follow-up with [facility].  Return or get seen for any significant vaginal bleeding or worse pain.  Based on today's labs are either early on pregnancy it is possible there could be a miscarriage is also possible it could be an ectopic pregnancy.  Close follow-up will be important.  Also urinalysis suggestive of urinary tract infection take the antibiotic Keflex as directed.  Also start the prenatal vitamins.

## 2018-03-19 NOTE — ED Provider Notes (Signed)
[facility] EMERGENCY DEPARTMENT Provider Note   CSN: [id number] Arrival date & time: 03/19/18  1336     History   Chief Complaint Chief Complaint  Patient presents with  . Dysuria    HPI Sheila Carroll is a 32 y.o. female.  Patient presents for frequent urination.  Denies any pain or blood with it to me.  It is associated with lower abdominal cramping.  Patient denied any nausea or vomiting or fevers.  No abdominal pain is in the suprapubic area.  Symptoms have been present for a week.  No vaginal bleeding no vaginal discharge.  Patient had her left fallopian tube removed due to an ectopic pregnancy in 2017.  Patient had in the late fall 2018 ectopic pregnancy in her right fallopian tube this was treated with methotrexate.  Patient is followed by [facility].  Patient's last menstrual period was July 31.  Patient's had 2 prior healthy deliveries.     Past Medical History:  Diagnosis Date  . Anxiety   . Asthma   . Chlamydia   . Depression with anxiety   . GERD (gastroesophageal reflux disease)   . Hypertension   . UTI (urinary tract infection)     Patient Active Problem List   Diagnosis Date Noted  . Post-operative state 07/18/2016    Past Surgical History:  Procedure Laterality Date  . CESAREAN SECTION     x 2  . DIAGNOSTIC LAPAROSCOPY WITH REMOVAL OF ECTOPIC PREGNANCY N/A 06/27/2016   Procedure: DIAGNOSTIC LAPAROSCOPY WITH REMOVAL OF ECTOPIC PREGNANCY;  Surgeon: Catalina Antigua, MD;  Location: [facility];  Service: Gynecology;  Laterality: N/A;  . FOOT SURGERY       OB History    Gravida  6   Para  2   Term  2   Preterm      AB  3   Living  2     SAB  2   TAB      Ectopic  1   Multiple      Live Births  2            Home Medications    Prior to Admission medications   Medication Sig Start Date End Date Taking? Authorizing Provider  escitalopram (LEXAPRO) 20 MG tablet Take 20 mg by mouth daily.   Yes [provider]  cephALEXin (KEFLEX) 500 MG capsule Take 1 capsule (500 mg total) by mouth 4 (four) times daily. 03/19/18   Vanetta Mulders, MD  Prenatal Vit-Fe Fumarate-FA (PRENATAL VITAMINS) 28-0.8 MG TABS Take 1 tablet by mouth daily. 03/19/18   Vanetta Mulders, MD    Family History History reviewed. No pertinent family history.  Social History Social History   Tobacco Use  . Smoking status: Never Smoker  . Smokeless tobacco: Never Used  Substance Use Topics  . Alcohol use: Yes    Comment: occ  . Drug use: Yes    Types: Marijuana    Comment: occasionally      Allergies   Ramipril   Review of Systems Review of Systems  Constitutional: Negative for fever.  HENT: Negative for congestion.   Eyes: Negative for visual disturbance.  Respiratory: Negative for shortness of breath.   Cardiovascular: Negative for chest pain.  Gastrointestinal: Positive for abdominal pain. Negative for nausea and vomiting.  Genitourinary: Positive for frequency. Negative for dysuria, hematuria and pelvic pain.  Musculoskeletal: Negative for back pain.  Skin: Negative for rash.  Neurological: Negative for light-headedness and headaches.  Hematological: Does not bruise/bleed easily.  Psychiatric/Behavioral: Negative for confusion.     Physical Exam Updated Vital Signs BP (!) 153/68 (BP Location: Left Arm)   Pulse 84   Temp 98.3 F (36.8 C)   Resp 16   Ht 1.6 m (5\' 3" )   Wt 99.8 kg   LMP 05/16/2017   SpO2 100%   Breastfeeding? Unknown   BMI 38.97 kg/m   Physical Exam  Constitutional: She is oriented to person, place, and time. She appears well-developed and well-nourished. No distress.  HENT:  Head: Normocephalic and atraumatic.  Mouth/Throat: Oropharynx is clear and moist.  Eyes: Pupils are equal, round, and reactive to light. Conjunctivae and EOM are normal.  Neck: Neck supple.  Cardiovascular: Normal rate, regular rhythm and normal heart sounds.  Pulmonary/Chest: Effort normal  and breath sounds normal. No respiratory distress.  Abdominal: Soft. Bowel sounds are normal. There is no tenderness.  Musculoskeletal: Normal range of motion.  Neurological: She is alert and oriented to person, place, and time. No cranial nerve deficit or sensory deficit. She exhibits normal muscle tone. Coordination normal.  Skin: Skin is warm.  Nursing note and vitals reviewed.    ED Treatments / Results  Labs (all labs ordered are listed, but only abnormal results are displayed) Labs Reviewed  URINALYSIS, ROUTINE W REFLEX MICROSCOPIC - Abnormal; Notable for the following components:      Result Value   Hgb urine dipstick TRACE (*)    Leukocytes, UA TRACE (*)    All other components within normal limits  PREGNANCY, URINE - Abnormal; Notable for the following components:   Preg Test, Ur POSITIVE (*)    All other components within normal limits  URINALYSIS, MICROSCOPIC (REFLEX) - Abnormal; Notable for the following components:   Bacteria, UA FEW (*)    All other components within normal limits  HCG, QUANTITATIVE, PREGNANCY - Abnormal; Notable for the following components:   hCG, Beta Chain, Quant, S 263 (*)    All other components within normal limits  CBC - Abnormal; Notable for the following components:   Hemoglobin 11.9 (*)    HCT 35.4 (*)    All other components within normal limits  BASIC METABOLIC PANEL - Abnormal; Notable for the following components:   Glucose, Bld 150 (*)    All other components within normal limits  URINE CULTURE    EKG None  Radiology US Ob Comp < 14 Wks  Result Date: 03/19/2018 CLINICAL DATA:  Pelvic cramping, HCG of 263 EXAM: OBSTETRIC <14 WK [place] AND TRANSVAGINAL OB US TECHNIQUE: Both transabdominal and transvaginal ultrasound examinations were performed for complete evaluation of the gestation as well as the maternal uterus, adnexal regions, and pelvic cul-de-sac. Transvaginal technique was performed to assess early pregnancy. COMPARISON:   None. FINDINGS: Intrauterine gestational sac: No intrauterine gestational sac is seen Yolk sac:  Not visible Embryo:  Not visible Maternal uterus/adnexae: Ovaries are within normal limits. The left ovary measures 2.2 x 3.5 x 2.2 cm. The right ovary measures 3.6 x 2.7 x 2.6 cm. There is moderate anechoic fluid within the pelvis. Small cervical cyst. IMPRESSION: 1. No IUP identified. Findings consistent with pregnancy of unknown location, differential of which includes IUP too early to visualize, recent failed pregnancy, and occult ectopic pregnancy. Recommend trending of HCG with follow-up ultrasound as indicated 2. Moderate free fluid in the pelvis. Electronically Signed   By: Jasmine Pang M.D.   On: 03/19/2018 17:31   US Ob Transvaginal  Result Date: 03/19/2018  CLINICAL DATA:  Pelvic cramping, HCG of 263 EXAM: OBSTETRIC <14 WK [place] AND TRANSVAGINAL OB US TECHNIQUE: Both transabdominal and transvaginal ultrasound examinations were performed for complete evaluation of the gestation as well as the maternal uterus, adnexal regions, and pelvic cul-de-sac. Transvaginal technique was performed to assess early pregnancy. COMPARISON:  None. FINDINGS: Intrauterine gestational sac: No intrauterine gestational sac is seen Yolk sac:  Not visible Embryo:  Not visible Maternal uterus/adnexae: Ovaries are within normal limits. The left ovary measures 2.2 x 3.5 x 2.2 cm. The right ovary measures 3.6 x 2.7 x 2.6 cm. There is moderate anechoic fluid within the pelvis. Small cervical cyst. IMPRESSION: 1. No IUP identified. Findings consistent with pregnancy of unknown location, differential of which includes IUP too early to visualize, recent failed pregnancy, and occult ectopic pregnancy. Recommend trending of HCG with follow-up ultrasound as indicated 2. Moderate free fluid in the pelvis. Electronically Signed   By: Jasmine Pang M.D.   On: 03/19/2018 17:31    Procedures Procedures (including critical care time)  Medications  Ordered in ED Medications - No data to display   Initial Impression / Assessment and Plan / ED Course  I have reviewed the triage vital signs and the nursing notes.  Pertinent labs & imaging results that were available during my care of the patient were reviewed by me and considered in my medical decision making (see chart for details).     Patient's urinalysis consistent with urinary tract infection.  Urine culture was sent.  Patient will be started on Keflex for this.  Patient's pregnancy test was positive.  Patient obviously is at high risk for ectopic pregnancy.  Left fallopian tube is missing.  Patient without any vaginal bleeding or significant abdominal pain but did have lower abdominal cramping.  No significant nausea or vomiting.  Not lightheaded.  No back pain.  But based on her high risk quantitative hCG was done and ultrasound to rule out ectopic was done.  Patient's last menstrual period was July 31.  But she states that they are very irregular.  Patient's quantitative hCG was 260 which is low indicating very early on pregnancy or ongoing miscarriage.  Ultrasound showed no evidence of intrauterine pregnancy.  However that may not be visible at this early pregnancy.  Patient will follow-up with [facility] for recheck of her quantitative hCG.  And close follow-up.  She will return for any new or worse symptoms.  Patient will be also started on prenatal vitamins.  Final Clinical Impressions(s) / ED Diagnoses   Final diagnoses:  Less than [redacted] weeks gestation of pregnancy  Acute cystitis without hematuria    ED Discharge Orders         Ordered    cephALEXin (KEFLEX) 500 MG capsule  4 times daily     03/19/18 2014    Prenatal Vit-Fe Fumarate-FA (PRENATAL VITAMINS) 28-0.8 MG TABS  Daily     03/19/18 2014           Vanetta Mulders, MD 03/19/18 2020

## 2018-03-19 NOTE — ED Triage Notes (Signed)
Pt c/ freq painful urination and nausea x 1 week

## 2018-03-21 LAB — URINE CULTURE: Culture: >=100000 — AB

## 2018-03-22 ENCOUNTER — Telehealth: Payer: Self-pay

## 2018-03-22 NOTE — Telephone Encounter (Signed)
Post ED Visit - Positive Culture Follow-up: Unsuccessful Patient Follow-up  Culture assessed and recommendations reviewed by:  []  Enzo Bi, Pharm.D. []  Celedonio Miyamoto, Pharm.D., BCPS AQ-ID []  Garvin Fila, Pharm.D., BCPS []  Georgina Pillion, Pharm.D., BCPS []  Arthur, [address].D., BCPS, AAHIVP []  Estella Husk, Pharm.D., BCPS, AAHIVP []  Sherlynn Carbon, PharmD []  Pollyann Samples, PharmD, BCPS Gwynneth Albright Pharm D Positive urine culture  []  Patient discharged without antimicrobial prescription and treatment is now indicated [x]  Organism is resistant to prescribed ED discharge antimicrobial Needs to return to ED or to PCP []  Patient with positive blood cultures   Unable to contact patient after 3 attempts, letter will be sent to address on file  Jerry Caras 03/22/2018, 10:51 AM

## 2018-03-24 ENCOUNTER — Inpatient Hospital Stay (HOSPITAL_COMMUNITY): Payer: Medicaid Other

## 2018-03-24 ENCOUNTER — Encounter (HOSPITAL_COMMUNITY): Payer: Self-pay | Admitting: *Deleted

## 2018-03-24 ENCOUNTER — Inpatient Hospital Stay (HOSPITAL_COMMUNITY)
Admission: AD | Admit: 2018-03-24 | Discharge: 2018-03-24 | Disposition: A | Discharge status: Home / Self Care | Payer: Medicaid Other | Source: Ambulatory Visit | Attending: Obstetrics and Gynecology | Admitting: Obstetrics and Gynecology

## 2018-03-24 DIAGNOSIS — O26891 Other specified pregnancy related conditions, first trimester: Secondary | ICD-10-CM

## 2018-03-24 DIAGNOSIS — Z9079 Acquired absence of other genital organ(s): Secondary | ICD-10-CM

## 2018-03-24 DIAGNOSIS — A4902 Methicillin resistant Staphylococcus aureus infection, unspecified site: Secondary | ICD-10-CM | POA: Insufficient documentation

## 2018-03-24 DIAGNOSIS — Z3A01 Less than 8 weeks gestation of pregnancy: Secondary | ICD-10-CM | POA: Diagnosis not present

## 2018-03-24 DIAGNOSIS — R799 Abnormal finding of blood chemistry, unspecified: Secondary | ICD-10-CM

## 2018-03-24 DIAGNOSIS — Z09 Encounter for follow-up examination after completed treatment for conditions other than malignant neoplasm: Secondary | ICD-10-CM | POA: Diagnosis not present

## 2018-03-24 DIAGNOSIS — R109 Unspecified abdominal pain: Secondary | ICD-10-CM

## 2018-03-24 DIAGNOSIS — I10 Essential (primary) hypertension: Secondary | ICD-10-CM | POA: Diagnosis present

## 2018-03-24 DIAGNOSIS — O2340 Unspecified infection of urinary tract in pregnancy, unspecified trimester: Secondary | ICD-10-CM | POA: Diagnosis present

## 2018-03-24 DIAGNOSIS — O219 Vomiting of pregnancy, unspecified: Secondary | ICD-10-CM | POA: Insufficient documentation

## 2018-03-24 DIAGNOSIS — IMO0002 Reserved for concepts with insufficient information to code with codable children: Secondary | ICD-10-CM

## 2018-03-24 DIAGNOSIS — O283 Abnormal ultrasonic finding on antenatal screening of mother: Secondary | ICD-10-CM

## 2018-03-24 DIAGNOSIS — O0281 Inappropriate change in quantitative human chorionic gonadotropin (hCG) in early pregnancy: Secondary | ICD-10-CM | POA: Insufficient documentation

## 2018-03-24 DIAGNOSIS — O0911 Supervision of pregnancy with history of ectopic or molar pregnancy, first trimester: Secondary | ICD-10-CM

## 2018-03-24 DIAGNOSIS — O3680X0 Pregnancy with inconclusive fetal viability, not applicable or unspecified: Secondary | ICD-10-CM

## 2018-03-24 DIAGNOSIS — O2341 Unspecified infection of urinary tract in pregnancy, first trimester: Secondary | ICD-10-CM | POA: Diagnosis not present

## 2018-03-24 LAB — URINALYSIS, ROUTINE W REFLEX MICROSCOPIC
BILIRUBIN URINE: NEGATIVE
Glucose, UA: NEGATIVE mg/dL
Hgb urine dipstick: NEGATIVE
KETONES UR: NEGATIVE mg/dL
Nitrite: NEGATIVE
PH: 6 (ref 5.0–8.0)
Protein, ur: NEGATIVE mg/dL
SPECIFIC GRAVITY, URINE: 1.03 (ref 1.005–1.030)

## 2018-03-24 LAB — HCG, QUANTITATIVE, PREGNANCY: HCG, BETA CHAIN, QUANT, S: 1241 m[IU]/mL — AB (ref <5)

## 2018-03-24 MED ORDER — NITROFURANTOIN MONOHYD MACRO 100 MG PO CAPS: 100.0000 mg | ORAL_CAPSULE | Freq: Two times a day (BID) | ORAL | 1 refills | Status: DC

## 2018-03-24 MED ORDER — PROMETHAZINE HCL 25 MG PO TABS: 25.0000 mg | ORAL_TABLET | Freq: Four times a day (QID) | ORAL | 3 refills | Status: DC | PRN

## 2018-03-24 NOTE — Discharge Instructions (Signed)
I am repeating the culture on your urine to make sure that it is accurate.  I will need to consult with our infectious disease department about the best choice of antibiotic for early pregnancy if the repeat culture shows MRSA.  Abdominal Pain During Pregnancy Abdominal pain is common in pregnancy. Most of the time, it does not cause harm. There are many causes of abdominal pain. Some causes are more serious than others and sometimes the cause is not known. Abdominal pain can be a sign that something is very wrong with the pregnancy or the pain may have nothing to do with the pregnancy. Always tell your health care provider if you have any abdominal pain. Follow these instructions at home:  Do not have sex or put anything in your vagina until your symptoms go away completely.  Watch your abdominal pain for any changes.  Get plenty of rest until your pain improves.  Drink enough fluid to keep your urine clear or pale yellow.  Take over-the-counter or prescription medicines only as told by your health care provider.  Keep all follow-up visits as told by your health care provider. This is important. Contact a health care provider if:  You have a fever.  Your pain gets worse or you have cramping.  Your pain continues after resting. Get help right away if:  You are bleeding, leaking fluid, or passing tissue from the vagina.  You have vomiting or diarrhea that does not go away.  You have painful or bloody urination.  You notice a decrease in your baby's movements.  You feel very weak or faint.  You have shortness of breath.  You develop a severe headache with abdominal pain.  You have abnormal vaginal discharge with abdominal pain. This information is not intended to replace advice given to you by your health care provider. Make sure you discuss any questions you have with your health care provider. Document Released: 07/03/2005 Document Revised: 04/13/2016 Document Reviewed:  01/30/2013 Elsevier Interactive Patient Education  2018 [employer]   Community-Associated MRSA MRSA stands for methicillin-resistant Staphylococcus aureus. It is a type ofinfection caused by bacteria that are no longer affected by common antibiotic medicines (drug-resistant bacteria). Infections with MRSA can occur in hospitals and other health care settings (healthcare-associated MRSA), and in the community (community-associated MRSA, or CA-MRSA). MRSA bacteria can spread from person to person by touching contaminated objects or through direct contact. Infections with MRSA may be very serious or even life-threatening. What are the causes? Staphylococcus aureus bacteria normally live on the skin or in the nose of some people. This usually does not cause problems. However, a MRSA infection can happen if the bacterium enters the body through a cut, wound, or break in the skin. What increases the risk? The following factors may make you more likely to get a CA-MRSA infection:  Close skin-to-skin contact with others.  Cuts and scratches that are not treated, not covered, or both.  Recent antibiotic medicine use.  Sharing contaminated towels or clothes.  Having active skin conditions.  Participating in contact sports.  Living in crowded settings.  Homelessness.  IV drug use.  What are the signs or symptoms? This condition usually starts with a skin infection. Signs and symptoms vary, and may include:  An area of skin that is red and swollen, feels painful, and is warm to the touch.  Pus under the skin or pus draining from the infected area.  Fever.  CA-MRSA infections are usually skin infections, but in  some cases, severe illness may develop, such as:  Pneumonia.  Bone or joint infections.  Bloodstream infections (sepsis).  Symptoms may vary as the infection gets worse. How is this diagnosed? This condition is diagnosed by taking a fluid sample (culture). The culture  may come from:  Swabs of cuts or wounds in infected areas.  Nasal swabs.  Saliva or deep-cough specimens from the lungs (sputum).  Urine.  Blood.  You may have imaging tests to check whether the infection has spread. Imaging tests may include:  X-rays.  MRI.  CT scan.  How is this treated? Treatment varies and is based on how serious, deep, and extensive the infection is. For example:  Some skin infections, such as a small boil or abscess, may be treated by draining pus from the site of the infection.  Deeper or more widespread soft tissue infections are usually treated with surgery to drain pus and with antibiotics that are given through a vein or by mouth. This may be recommended even if you are pregnant.  Serious infections may require a hospital stay.  If antibiotics are prescribed, you may need to take them for several weeks. Follow these instructions at home: Medicines  Take your antibiotic as told by your health care provider. Do not stop taking the antibiotic even if you start to feel better.  Take over-the-counter and prescription medicines only as told by your health care provider. General instructions  Wash your hands with soap and water often.Ask anyone who lives with you to wash their hands often, too. If soap and water are not available, use hand sanitizer.  Do not use towels, razors, toothbrushes, bedding, or other items that will be used by others.  Follow instructions from your health care provider about how to take care of your wound. Make sure you: ? Wash your hands with soap and water before you change your bandage (dressing). If soap and water are not available, use hand sanitizer. ? Change your dressing as told by your health care provider. ? Leave stitches (sutures), skin glue, or adhesive strips in place. These skin closures may need to be in place for 2 weeks or longer. If adhesive strip edges start to loosen and curl up, you may trim the loose  edges. Do not remove adhesive strips completely unless your health care provider tells you to do that.  Tell any health care providers who care for you that you have MRSA so they are aware of your infection.  Keep all follow-up visits as told by your health care provider. This is important. How is this prevented?   Wash your hands frequently with soap and water for at least 20 seconds. If soap and water are not available, use hand sanitizer. Make sure that everyone in your household washes their hands, too.  Wash and dry your clothes and bedding at the warmest temperatures that are recommended on the labels.  Maintain good hygiene by bathing often and keeping your body clean.  Clean wounds, cuts, and abrasions with soap and water and cover them with dry, germ-free (sterile) dressings until they heal.  If you have a wound that seems to be infected, ask your health care provider if a culture should be done for MRSA and other bacteria.  If you are breastfeeding, talk to your health care provider about MRSA. You may be asked to temporarily stop breastfeeding. Contact a health care provider if:  Your infection seems to be getting worse. Signs may include: ? More warmth,  redness, or tenderness around your wound site. ? A red line that spreads from your infection site. ? A dark color in the area around your infection. ? Wound drainage that is tan, yellow, or green. ? A bad smell coming from your wound.  You feel nauseous, you vomit, or you cannot take medicine without vomiting.  You have a fever.  You have difficulty breathing. This information is not intended to replace advice given to you by your health care provider. Make sure you discuss any questions you have with your health care provider. Document Released: 10/06/2005 Document Revised: 02/25/2016 Document Reviewed: 07/08/2015 Elsevier Interactive Patient Education  2017 [employer].

## 2018-03-24 NOTE — MAU Provider Note (Signed)
History    First Provider Initiated Contact with Patient 03/24/18 [phone]      Chief Complaint:  Follow-up   Sheila Carroll is  32 y.o. [id number] Patient's last menstrual period was 02/13/2018.Marland Kitchen Patient is here for follow up of quantitative HCG and ongoing surveillance of pregnancy status.   She is [redacted]w[redacted]d weeks gestation  by LMP.  Was seen at [facility] on 03/19/2018 for low abdominal cramping and dysuria.  Diagnosed with UTI.  Ectopic work-up performed due to cramping and history of ectopic pregnancy x2.  Quantitative hCG was 263.  Nothing seen on ultrasound.  Patient was instructed to follow-up with [facility] or [facility].  This is her first follow-up encounter since 03/19/2018.  Urine culture from that visit came back positive for MRSA UTI.  Per post-ED visit positive culture F/U note on 03/22/18 "Organism is resistant to prescribed ED discharge antimicrobial Needs to return to ED or to PCP". "Unable to contact patient after 3 attempts, letter will be sent to address on file." Pt states she was not aware of these calls. Number is correct in Epic.   Since her last visit, the patient is without new complaint.     ROS Abdominal Pain: Denies Vaginal bleeding: None.   Passage of clots or tissue: Denies Dizziness: Denies  B POS  Her previous Quantitative HCG values are:   Results for Sheila Carroll (MRN [id number]) as of 03/24/2018 10:47  Ref. Range 03/19/2018 16:11  HCG, Beta Chain, Quant, S Latest Ref Range: <5 mIU/mL 263 (H)   Physical Exam   Patient Vitals for the past 24 hrs:  BP Temp Temp src Pulse Resp Height Weight  03/24/18 0748 124/63 97.6 F (36.4 C) Oral 96 18 5\' 3"  (1.6 m) 100 kg   Constitutional: Well-nourished female in no apparent distress. No pallor Neuro: Alert and oriented 4 Cardiovascular: Normal rate Respiratory: Normal effort and rate Gynecological Exam: Not indicated  Labs: Results for orders placed or performed during the hospital  encounter of 03/24/18 (from the past 24 hour(s))  hCG, quantitative, pregnancy   Collection Time: 03/24/18  7:39 AM  Result Value Ref Range   hCG, Beta Chain, Quant, S 1,241 (H) <5 mIU/mL    Ultrasound Studies:   US Ob Transvaginal  Result Date: 03/24/2018 CLINICAL DATA:  Abdominal pain.  History of ectopic pregnancy. EXAM: TRANSVAGINAL OB ULTRASOUND TECHNIQUE: Transvaginal ultrasound was performed for complete evaluation of the gestation as well as the maternal uterus, adnexal regions, and pelvic cul-de-sac. COMPARISON:  None. FINDINGS: Intrauterine gestational sac: Small 3 mm rounded collection of fluid within the endometrium. Yolk sac:  Not Visualized. Embryo:  Not Visualized. MSD: 3 mm   5 w   0 d CRL:     mm    w  d                  [place] EDC: Subchorionic hemorrhage:  None visualized. Maternal uterus/adnexae: The ovaries are unremarkable. Trace fluid in the cul-de-sac is likely physiologic. IMPRESSION: 1. A 3 mm collection of fluid in the endometrium may represent an early gestational sac. Probable early intrauterine gestational sac, but no yolk sac, fetal pole, or cardiac activity yet visualized. Recommend follow-up quantitative B-HCG levels and follow-up US in 14 days to assess viability. This recommendation follows SRU consensus guidelines: Diagnostic Criteria for Nonviable Pregnancy Early in the First Trimester. Sheila Carroll Med 2013; 284:1324-40. Electronically Signed   By: Gerome Sam III M.D   On: 03/24/2018  11:11     MAU course Quantitative hCG ordered.  hCG levels did not double every 48 hours since 03/19/2018 but they did rise the equivalent of 60% every 48 hours since then.  With patient's history of ectopic pregnancy x2 ultrasound was ordered today.  Discussed with Dr. Vergie Living.  Agrees with plan of care.  MDM: - Cramping in early pregnancy. Pregnancy of unknown anatomic location, but somewhat encouraging that there has been the development of a probable GS. No evidence of ectopic  pregnancy in [place] today. Hemodynamically stable.  - Discussed Urine culture + >100K MRSA w/ Pharmacist and Dr. Vergie Living. No PO ABX safe for first trimester sensitive to organism per culture. Options include giving Bactrim w/ high-dose folic acic to try to counter-act the anti-folate side effects vs daily IV doses of Gent in MAU (which still has uncertain safety in first trimester). Due to these safety concerns of Tx options and that MRSA UTI is uncommon and pos culture may be from contamination, new clean catch culture was sent. Will F/U w/ ID PRN if culture is still positive.   Assessment: 1. Pregnancy of unknown anatomic location   2. History of unilateral salpingectomy   3. Pregnancy in first trimester with history of ectopic pregnancy   4. Abnormal human chorionic gonadotropin (hCG)   5. Abdominal pain during pregnancy in first trimester   6. MRSA (methicillin resistant Staphylococcus aureus) infection   7. UTI (urinary tract infection) during pregnancy, first trimester   8. Nausea and vomiting of pregnancy, antepartum    Plan: Discharge home in stable condition. Ectopic and SAB precautions Pyelo precautions. Urine culture pending Follow-up Information    [facility] [place] MATERNITY ADMISSIONS Follow up.   Why:  As needed in pregnancy emergencies  Contact information: [address] [id number] mc [place] [address] [phone]       [facility] Follow up on 03/26/2018.   Specialty:  Obstetrics and Gynecology Why:  For repeat lab work Contact information: [address] [place] [address] [phone]         Allergies as of 03/24/2018      Reactions   Ramipril Cough      Medication List    STOP taking these medications   cephALEXin 500 MG capsule Commonly known as:  KEFLEX     TAKE these medications   escitalopram 20 MG tablet Commonly known as:  LEXAPRO Take 20 mg by mouth  daily.   Prenatal Vitamins 28-0.8 MG Tabs Take 1 tablet by mouth daily.   promethazine 25 MG tablet Commonly known as:  PHENERGAN Take 1 tablet (25 mg total) by mouth every 6 (six) hours as needed.       Katrinka Blazing, [place], CNM 03/24/2018, 11:52 AM  2/3

## 2018-03-24 NOTE — MAU Note (Addendum)
Just here for follow up blood work, was at [facility] and they referred here over here for follow up. Unsure if she also needs repeat u/s  No pain or bleeding

## 2018-03-25 DIAGNOSIS — O2340 Unspecified infection of urinary tract in pregnancy, unspecified trimester: Secondary | ICD-10-CM | POA: Diagnosis present

## 2018-03-25 DIAGNOSIS — A4902 Methicillin resistant Staphylococcus aureus infection, unspecified site: Secondary | ICD-10-CM

## 2018-03-27 LAB — CULTURE, OB URINE: Culture: 50000 — AB

## 2018-03-28 ENCOUNTER — Ambulatory Visit (INDEPENDENT_AMBULATORY_CARE_PROVIDER_SITE_OTHER): Payer: Self-pay | Admitting: General Practice

## 2018-03-28 ENCOUNTER — Telehealth: Payer: Self-pay | Admitting: Advanced Practice Midwife

## 2018-03-28 DIAGNOSIS — O283 Abnormal ultrasonic finding on antenatal screening of mother: Secondary | ICD-10-CM

## 2018-03-28 DIAGNOSIS — O3680X0 Pregnancy with inconclusive fetal viability, not applicable or unspecified: Secondary | ICD-10-CM

## 2018-03-28 LAB — HCG, QUANTITATIVE, PREGNANCY: hCG, Beta Chain, Quant, S: 4295 m[IU]/mL — ABNORMAL HIGH (ref <5)

## 2018-03-28 MED ORDER — FOLIC ACID 1 MG PO TABS: 1.0000 mg | ORAL_TABLET | Freq: Every day | ORAL | 0 refills | Status: AC

## 2018-03-28 MED ORDER — SULFAMETHOXAZOLE-TRIMETHOPRIM 800-160 MG PO TABS: 1.0000 | ORAL_TABLET | Freq: Two times a day (BID) | ORAL | 0 refills | Status: AC

## 2018-03-28 NOTE — Telephone Encounter (Signed)
Pt urine culture with 50,000 colonies of MRSA, repeated culture done after previous culture positive.  Treatment withheld at previous visit due to potential risk of Bactrim PO in first trimester or Gentamycin IV in first trimester.  Consult Dr Macon LargeAnyanwu with results today.  Treat with Bactrim DS BID x 7 days.  Pt to take PNV daily and add Folic acid 1000 mg PO daily x 7 days as additional protection against anti-folate properties of Bactrim. Rx for both Bactrim and Folic Acid [facility] [place]. Left message for pt to return call regarding her recent results.

## 2018-03-28 NOTE — Telephone Encounter (Signed)
Pt returned call about abx for MRSA UTI.  Pt will pick up Rx for Bactrim and Folic Acid supplement.  She missed appt for repeat hcg due to work.  Appt scheduled today [facility] at 11:00. Pt to wait 2 hours for results.  Plan for US will be determined by today's hcg.  Pt states understanding.

## 2018-03-28 NOTE — Progress Notes (Signed)
Patient presents to office today for stat bhcg. Patient denies pain or bleeding. Reports some nausea/vomiting. Discussed with patient we are monitoring your bhcg levels today & asked she wait in lobby for results/updated plan of care. Patient verbalized understanding to all & had no questions at this time.  Reviewed results with Dr Debroah LoopArnold who finds appropriate rise in bhcg levels. Patient should have follow up ultrasound in 10 days. Scheduled 9/24 @ 3pm.  Informed patient of results & ultrasound appt. Ectopic precautions reviewed. Patient verbalized understanding to all & had no questions.

## 2018-04-09 ENCOUNTER — Ambulatory Visit (INDEPENDENT_AMBULATORY_CARE_PROVIDER_SITE_OTHER): Payer: Self-pay | Admitting: Student

## 2018-04-09 ENCOUNTER — Ambulatory Visit (HOSPITAL_COMMUNITY)
Admission: RE | Admit: 2018-04-09 | Discharge: 2018-04-09 | Disposition: A | Discharge status: Home / Self Care | Payer: Medicaid Other | Source: Ambulatory Visit | Attending: Obstetrics & Gynecology | Admitting: Obstetrics & Gynecology

## 2018-04-09 ENCOUNTER — Other Ambulatory Visit: Payer: Self-pay | Admitting: Student

## 2018-04-09 ENCOUNTER — Encounter: Payer: Self-pay | Admitting: Student

## 2018-04-09 DIAGNOSIS — O3680X0 Pregnancy with inconclusive fetal viability, not applicable or unspecified: Secondary | ICD-10-CM

## 2018-04-09 DIAGNOSIS — Z3A01 Less than 8 weeks gestation of pregnancy: Secondary | ICD-10-CM | POA: Insufficient documentation

## 2018-04-09 DIAGNOSIS — O283 Abnormal ultrasonic finding on antenatal screening of mother: Secondary | ICD-10-CM | POA: Diagnosis not present

## 2018-04-09 DIAGNOSIS — O021 Missed abortion: Secondary | ICD-10-CM

## 2018-04-09 MED ORDER — ZOLPIDEM TARTRATE 10 MG PO TABS: 10.0000 mg | ORAL_TABLET | Freq: Every evening | ORAL | 0 refills | Status: DC | PRN

## 2018-04-09 NOTE — Patient Instructions (Signed)

## 2018-04-09 NOTE — Progress Notes (Signed)
Pt here for US results:   IMPRESSION: Single intrauterine gestational sac measuring 5 weeks 5 days by mean sac diameter. Irregular sac shape is a poor prognostic sign. Consider following b-hCG levels, with followup ultrasound to assess viability in 10-14 days.  Reviewed with Larna Daughtersharlie Pickens,MD,  Advised for pt to be seen today by Luna KitchensKathryn Kooistra, CNM to discuss SAB but wants pt to have a f/u US & Beta 10 days from today.

## 2018-04-09 NOTE — Progress Notes (Signed)
Patient ID: Sheila Carroll, female   DOB: 04/29/1986, 32 y.o.   MRN: [id number] History:  Ms. Sheila Sheila Carroll is a 32 y.o. [id number] who presents to clinic today for follow-up US. She denies pain or bleeding.   The following portions of the patient's history were reviewed and updated as appropriate: allergies, current medications, family history, past medical history, social history, past surgical history and problem list.  Review of Systems:  Review of Systems  Constitutional: Negative.   HENT: Negative.   Respiratory: Negative.   Cardiovascular: Negative.   Genitourinary: Negative.   Musculoskeletal: Negative.   Skin: Negative.   Neurological: Negative.   Psychiatric/Behavioral: Negative.       Objective:  Physical Exam LMP 02/13/2018  Physical Exam  Constitutional: She appears well-developed.  HENT:  Head: Normocephalic.  Neck: Normal range of motion.  Pulmonary/Chest: Effort normal.  Abdominal: Soft.  Neurological: She is alert.  Skin: Skin is warm.     Labs and Imaging No results found for this or any previous visit (from the past 24 hour(s)).  [place] Ob Transvaginal  Result Date: 04/09/2018 CLINICAL DATA:  First trimester pregnancy with inconclusive fetal viability. EXAM: TRANSVAGINAL OB ULTRASOUND TECHNIQUE: Transvaginal ultrasound was performed for complete evaluation of the gestation as well as the maternal uterus, adnexal regions, and pelvic cul-de-sac. COMPARISON:  03/24/2018 FINDINGS: Intrauterine gestational sac: Single; irregular shape noted Yolk sac:  Not Visualized. Embryo:  Not Visualized. Cardiac Activity: Not Visualized. MSD: 11 mm   5 w   5 d Subchorionic hemorrhage:  None visualized. Maternal uterus/adnexae: Normal appearance of both ovaries. No adnexal mass or abnormal free fluid identified. IMPRESSION: Single intrauterine gestational sac measuring 5 weeks 5 days by mean sac diameter. Irregular sac shape is a poor prognostic sign. Consider following b-hCG  levels, with followup ultrasound to assess viability in 10-14 days. No adnexal mass or abnormal free fluid. Electronically Signed   By: Myles RosenthalJohn  Stahl M.D.   On: 04/09/2018 15:36     Assessment & Plan:  1. Encounter to determine fetal viability of pregnancy, single or unspecified fetus  Discussed case with Dr. Vergie LivingPickens, who recommends patient be counseled that this is most likely a failed pregnancy.  Patient is very upset. Patient story is inconsistent; she says she does not want anything to stop the pregnancy as "the pills took their toll on me last time" but also says that she never had pills but instead had "the shot" to stop pregnancy. Difficult to communicate with patient but she does desire D and C. Patient agrees to go home and think for a few days and then return for fup with MD to discuss D and C. This plan was discussed with Dr. Vergie LivingPickens, who agrees and recommends beta today. Will also add CBC in case patient decides to do cytotec on Thursday.   -Return precautions reviewed.  - CBC - Beta hCG quant (ref lab)  Sheila Carroll, Sheila Carroll, CNM 04/09/2018 5:42 PM

## 2018-04-10 LAB — CBC
Hematocrit: 34.2 % (ref 34.0–46.6)
Hemoglobin: 11.3 g/dL (ref 11.1–15.9)
MCH: 28.4 pg (ref 26.6–33.0)
MCHC: 33 g/dL (ref 31.5–35.7)
MCV: 86 fL (ref 79–97)
PLATELETS: 333 10*3/uL (ref 150–450)
RBC: 3.98 x10E6/uL (ref 3.77–5.28)
RDW: 13.5 % (ref 12.3–15.4)
WBC: 9.4 10*3/uL (ref 3.4–10.8)

## 2018-04-10 LAB — BETA HCG QUANT (REF LAB): hCG Quant: 2689 m[IU]/mL

## 2018-04-11 ENCOUNTER — Encounter: Payer: Self-pay | Admitting: Family Medicine

## 2018-04-11 ENCOUNTER — Ambulatory Visit (INDEPENDENT_AMBULATORY_CARE_PROVIDER_SITE_OTHER): Payer: Self-pay | Admitting: Family Medicine

## 2018-04-11 VITALS — BP 146/90 | HR 82 | Ht 63.0 in | Wt 220.8 lb

## 2018-04-11 DIAGNOSIS — O2620 Pregnancy care for patient with recurrent pregnancy loss, unspecified trimester: Secondary | ICD-10-CM

## 2018-04-11 DIAGNOSIS — O021 Missed abortion: Secondary | ICD-10-CM

## 2018-04-11 MED ORDER — OMEPRAZOLE MAGNESIUM 20 MG PO TBEC: 20.0000 mg | DELAYED_RELEASE_TABLET | Freq: Every day | ORAL | 3 refills | Status: DC

## 2018-04-11 MED ORDER — ZOLPIDEM TARTRATE 10 MG PO TABS: 10.0000 mg | ORAL_TABLET | Freq: Every evening | ORAL | 0 refills | Status: DC | PRN

## 2018-04-11 NOTE — Patient Instructions (Signed)

## 2018-04-11 NOTE — Progress Notes (Signed)
   Subjective:    Patient ID: Sheila Carroll is a 32 y.o. female presenting with Surgical Consult  on 04/11/2018  HPI: Here following SAB. Has h/o SAB x 3 now. Also with Ectopic x 2 including 1 salpingectomy followed by MTX approx. 1 year later. This time has falling HCGs and [place] which shows probable irregularly shaped IUGS without yolk sac or fetal pole. Has been bleeding fairly heavily since last visit 2 days ago. Has cramping, but does not feel she can take NSAIDS due to GERD. Also, would like w/u for recurrent ABs.  Review of Systems  Constitutional: Negative for chills and fever.  Respiratory: Negative for shortness of breath.   Cardiovascular: Negative for chest pain.  Gastrointestinal: Positive for abdominal pain (cramping). Negative for nausea and vomiting.  Genitourinary: Positive for vaginal bleeding. Negative for dysuria.  Skin: Negative for rash.   Lab Results  Component Value Date   HCGQUANT 2,689 04/09/2018   Lab Results  Component Value Date   HCGBETAQNT 4,295 (H) 03/28/2018   HCGBETAQNT 1,241 (H) 03/24/2018   U/S 9/24 Single intrauterine gestational sac measuring 5 weeks 5 days by mean sac diameter. Irregular sac shape is a poor prognostic sign. Consider following b-hCG levels, with followup ultrasound to assess viability in 10-14 days. No adnexal mass or abnormal free fluid.  U/S 9/8 A 3 mm collection of fluid in the endometrium may represent an early gestational sac. Probable early intrauterine gestational sac, but no yolk sac, fetal pole, or cardiac activity yet visualized. Objective:    BP (!) 146/90   Pulse 82   Ht 5\' 3"  (1.6 m)   Wt 220 lb 12.8 oz (100.2 kg)   LMP 02/13/2018   BMI 39.11 kg/m  Physical Exam  Constitutional: She is oriented to person, place, and time. She appears well-developed and well-nourished. No distress.  HENT:  Head: Normocephalic and atraumatic.  Eyes: No scleral icterus.  Neck: Neck supple.  Cardiovascular: Normal rate.    Pulmonary/Chest: Effort normal.  Abdominal: Soft.  Neurological: She is alert and oriented to person, place, and time.  Skin: Skin is warm and dry.  Psychiatric: She has a normal mood and affect.        Assessment & Plan:   Problem List Items Addressed This Visit      Unprioritized   Missed abortion    Expectant management reviewed. She does not want surgery or "pills". She will remain abstinent.      History of recurrent abortion, antepartum - Primary    Needs w/u--will get today--discuss results at next visit.      Relevant Medications   omeprazole (PRILOSEC OTC) 20 MG tablet   Other Relevant Orders   Cardiolipin antibodies, IgM+IgG   Lupus anticoagulant panel   TSH   Hemoglobin A1c     To discuss contraception following w/u--does not want pregnancy right now.  Total face-to-face time with patient: 15 minutes. Over 50% of encounter was spent on counseling and coordination of care. Return in about 2 weeks (around 04/25/2018).  Reva Bores 04/11/2018 11:26 AM

## 2018-04-12 DIAGNOSIS — O2620 Pregnancy care for patient with recurrent pregnancy loss, unspecified trimester: Secondary | ICD-10-CM

## 2018-04-12 DIAGNOSIS — N96 Recurrent pregnancy loss: Secondary | ICD-10-CM | POA: Insufficient documentation

## 2018-04-12 HISTORY — DX: Pregnancy care for patient with recurrent pregnancy loss, unspecified trimester: O26.20

## 2018-04-12 NOTE — Assessment & Plan Note (Signed)
Expectant management reviewed. She does not want surgery or "pills". She will remain abstinent.

## 2018-04-12 NOTE — Assessment & Plan Note (Signed)
Needs w/u--will get today--discuss results at next visit.

## 2018-04-13 LAB — LUPUS ANTICOAGULANT PANEL
DRVVT: 32.3 s (ref 0.0–47.0)
PTT Lupus Anticoagulant: 34.1 s (ref 0.0–51.9)

## 2018-04-13 LAB — HEMOGLOBIN A1C
Est. average glucose Bld gHb Est-mCnc: 134 mg/dL
Hgb A1c MFr Bld: 6.3 % — ABNORMAL HIGH (ref 4.8–5.6)

## 2018-04-13 LAB — CARDIOLIPIN ANTIBODIES, IGM+IGG: Anticardiolipin IgM: <9 MPL U/mL (ref 0–12)

## 2018-04-13 LAB — TSH: TSH: 1.36 u[IU]/mL (ref 0.450–4.500)

## 2018-04-19 ENCOUNTER — Ambulatory Visit (HOSPITAL_COMMUNITY): Payer: Medicaid Other

## 2018-04-29 ENCOUNTER — Encounter: Payer: Self-pay | Admitting: Family Medicine

## 2018-04-29 ENCOUNTER — Ambulatory Visit: Payer: Self-pay | Admitting: Family Medicine

## 2018-04-29 NOTE — Progress Notes (Signed)
Patient did not keep appointment today. She may call to reschedule.  

## 2018-05-20 ENCOUNTER — Telehealth: Payer: Self-pay | Admitting: Emergency Medicine

## 2018-05-20 NOTE — Telephone Encounter (Signed)
Lost to followup 

## 2018-05-22 ENCOUNTER — Encounter: Payer: Self-pay | Admitting: Family Medicine

## 2018-05-22 ENCOUNTER — Ambulatory Visit (INDEPENDENT_AMBULATORY_CARE_PROVIDER_SITE_OTHER): Payer: Medicaid Other | Admitting: Family Medicine

## 2018-05-22 VITALS — BP 124/70 | HR 73 | Wt 222.2 lb

## 2018-05-22 DIAGNOSIS — N96 Recurrent pregnancy loss: Secondary | ICD-10-CM

## 2018-05-22 DIAGNOSIS — Z3009 Encounter for other general counseling and advice on contraception: Secondary | ICD-10-CM

## 2018-05-22 NOTE — Assessment & Plan Note (Signed)
?   Related to poor glycemic control--did educate about exercise, diet, avoidance of concentrated sweets, juices, sodas, and carbs.

## 2018-05-22 NOTE — Patient Instructions (Signed)
Carbohydrate Counting for Diabetes Mellitus, Adult Carbohydrate counting is a method for keeping track of how many carbohydrates you eat. Eating carbohydrates naturally increases the amount of sugar (glucose) in the blood. Counting how many carbohydrates you eat helps keep your blood glucose within normal limits, which helps you manage your diabetes (diabetes mellitus). It is important to know how many carbohydrates you can safely have in each meal. This is different for every person. A diet and nutrition specialist (registered dietitian) can help you make a meal plan and calculate how many carbohydrates you should have at each meal and snack. Carbohydrates are found in the following foods:  Grains, such as breads and cereals.  Dried beans and soy products.  Starchy vegetables, such as potatoes, peas, and corn.  Fruit and fruit juices.  Milk and yogurt.  Sweets and snack foods, such as cake, cookies, candy, chips, and soft drinks.  How do I count carbohydrates? There are two ways to count carbohydrates in food. You can use either of the methods or a combination of both. Reading "Nutrition Facts" on packaged food The "Nutrition Facts" list is included on the labels of almost all packaged foods and beverages in the [place] It includes:  The serving size.  Information about nutrients in each serving, including the grams (g) of carbohydrate per serving.  To use the "Nutrition Facts":  Decide how many servings you will have.  Multiply the number of servings by the number of carbohydrates per serving.  The resulting number is the total amount of carbohydrates that you will be having.  Learning standard serving sizes of other foods When you eat foods containing carbohydrates that are not packaged or do not include "Nutrition Facts" on the label, you need to measure the servings in order to count the amount of carbohydrates:  Measure the foods that you will eat with a food scale or  measuring cup, if needed.  Decide how many standard-size servings you will eat.  Multiply the number of servings by 15. Most carbohydrate-rich foods have about 15 g of carbohydrates per serving. ? For example, if you eat 8 oz (170 g) of strawberries, you will have eaten 2 servings and 30 g of carbohydrates (2 servings x 15 g = 30 g).  For foods that have more than one food mixed, such as soups and casseroles, you must count the carbohydrates in each food that is included.  The following list contains standard serving sizes of common carbohydrate-rich foods. Each of these servings has about 15 g of carbohydrates:   hamburger bun or  English muffin.   oz (15 mL) syrup.   oz (14 g) jelly.  1 slice of bread.  1 six-inch tortilla.  3 oz (85 g) cooked rice or pasta.  4 oz (113 g) cooked dried beans.  4 oz (113 g) starchy vegetable, such as peas, corn, or potatoes.  4 oz (113 g) hot cereal.  4 oz (113 g) mashed potatoes or  of a large baked potato.  4 oz (113 g) canned or frozen fruit.  4 oz (120 mL) fruit juice.  4-6 crackers.  6 chicken nuggets.  6 oz (170 g) unsweetened dry cereal.  6 oz (170 g) plain fat-free yogurt or yogurt sweetened with artificial sweeteners.  8 oz (240 mL) milk.  8 oz (170 g) fresh fruit or one small piece of fruit.  24 oz (680 g) popped popcorn.  Example of carbohydrate counting Sample meal  3 oz (85 g) chicken breast.    6 oz (170 g) brown rice.  4 oz (113 g) corn.  8 oz (240 mL) milk.  8 oz (170 g) strawberries with sugar-free whipped topping. Carbohydrate calculation 1. Identify the foods that contain carbohydrates: ? Rice. ? Corn. ? Milk. ? Strawberries. 2. Calculate how many servings you have of each food: ? 2 servings rice. ? 1 serving corn. ? 1 serving milk. ? 1 serving strawberries. 3. Multiply each number of servings by 15 g: ? 2 servings rice x 15 g = 30 g. ? 1 serving corn x 15 g = 15 g. ? 1 serving milk x 15  g = 15 g. ? 1 serving strawberries x 15 g = 15 g. 4. Add together all of the amounts to find the total grams of carbohydrates eaten: ? 30 g + 15 g + 15 g + 15 g = 75 g of carbohydrates total. This information is not intended to replace advice given to you by your health care provider. Make sure you discuss any questions you have with your health care provider. Document Released: 07/03/2005 Document Revised: 01/21/2016 Document Reviewed: 12/15/2015 Elsevier Interactive Patient Education  2018 [employer] Levonorgestrel intrauterine device (IUD) What is this medicine? LEVONORGESTREL IUD (LEE voe nor jes trel) is a contraceptive (birth control) device. The device is placed inside the uterus by a healthcare professional. It is used to prevent pregnancy. This device can also be used to treat heavy bleeding that occurs during your period. This medicine may be used for other purposes; ask your health care provider or pharmacist if you have questions. COMMON BRAND NAME(S): Cameron AliKyleena, LILETTA, Mirena, Skyla What should I tell my health care provider before I take this medicine? They need to know if you have any of these conditions: -abnormal Pap smear -cancer of the breast, uterus, or cervix -diabetes -endometritis -genital or pelvic infection now or in the past -have more than one sexual partner or your partner has more than one partner -heart disease -history of an ectopic or tubal pregnancy -immune system problems -IUD in place -liver disease or tumor -problems with blood clots or take blood-thinners -seizures -use intravenous drugs -uterus of unusual shape -vaginal bleeding that has not been explained -an unusual or allergic reaction to levonorgestrel, other hormones, silicone, or polyethylene, medicines, foods, dyes, or preservatives -pregnant or trying to get pregnant -breast-feeding How should I use this medicine? This device is placed inside the uterus by a health care  professional. Talk to your pediatrician regarding the use of this medicine in children. Special care may be needed. Overdosage: If you think you have taken too much of this medicine contact a poison control center or emergency room at once. NOTE: This medicine is only for you. Do not share this medicine with others. What if I miss a dose? This does not apply. Depending on the brand of device you have inserted, the device will need to be replaced every 3 to 5 years if you wish to continue using this type of birth control. What may interact with this medicine? Do not take this medicine with any of the following medications: -amprenavir -bosentan -fosamprenavir This medicine may also interact with the following medications: -aprepitant -armodafinil -barbiturate medicines for inducing sleep or treating seizures -bexarotene -boceprevir -griseofulvin -medicines to treat seizures like carbamazepine, ethotoin, felbamate, oxcarbazepine, phenytoin, topiramate -modafinil -pioglitazone -rifabutin -rifampin -rifapentine -some medicines to treat HIV infection like atazanavir, efavirenz, indinavir, lopinavir, nelfinavir, tipranavir, ritonavir -St. John's wort -warfarin This list may not describe all possible  interactions. Give your health care provider a list of all the medicines, herbs, non-prescription drugs, or dietary supplements you use. Also tell them if you smoke, drink alcohol, or use illegal drugs. Some items may interact with your medicine. What should I watch for while using this medicine? Visit your doctor or health care professional for regular check ups. See your doctor if you or your partner has sexual contact with others, becomes HIV positive, or gets a sexual transmitted disease. This product does not protect you against HIV infection (AIDS) or other sexually transmitted diseases. You can check the placement of the IUD yourself by reaching up to the top of your vagina with clean  fingers to feel the threads. Do not pull on the threads. It is a good habit to check placement after each menstrual period. Call your doctor right away if you feel more of the IUD than just the threads or if you cannot feel the threads at all. The IUD may come out by itself. You may become pregnant if the device comes out. If you notice that the IUD has come out use a backup birth control method like condoms and call your health care provider. Using tampons will not change the position of the IUD and are okay to use during your period. This IUD can be safely scanned with magnetic resonance imaging (MRI) only under specific conditions. Before you have an MRI, tell your healthcare provider that you have an IUD in place, and which type of IUD you have in place. What side effects may I notice from receiving this medicine? Side effects that you should report to your doctor or health care professional as soon as possible: -allergic reactions like skin rash, itching or hives, swelling of the face, lips, or tongue -fever, flu-like symptoms -genital sores -high blood pressure -no menstrual period for 6 weeks during use -pain, swelling, warmth in the leg -pelvic pain or tenderness -severe or sudden headache -signs of pregnancy -stomach cramping -sudden shortness of breath -trouble with balance, talking, or walking -unusual vaginal bleeding, discharge -yellowing of the eyes or skin Side effects that usually do not require medical attention (report to your doctor or health care professional if they continue or are bothersome): -acne -breast pain -change in sex drive or performance -changes in weight -cramping, dizziness, or faintness while the device is being inserted -headache -irregular menstrual bleeding within first 3 to 6 months of use -nausea This list may not describe all possible side effects. Call your doctor for medical advice about side effects. You may report side effects to FDA at  1-800-FDA-1088. Where should I keep my medicine? This does not apply. NOTE: This sheet is a summary. It may not cover all possible information. If you have questions about this medicine, talk to your doctor, pharmacist, or health care provider.  2018 Elsevier/Gold Standard (2016-04-14 14:14:56)

## 2018-05-22 NOTE — Progress Notes (Signed)
   Subjective:    Patient ID: Sheila Carroll is a 32 y.o. female presenting with Miscarriage  on 05/22/2018  HPI: Here today for f/u. Previously seen with w/u for recurrent AB. Labs showed pre-diabetes, negative anti-cardiolipin, negative TSH. Desires contraception.  Review of Systems  Constitutional: Negative for chills and fever.  Respiratory: Negative for shortness of breath.   Cardiovascular: Negative for chest pain.  Gastrointestinal: Negative for abdominal pain, nausea and vomiting.  Genitourinary: Negative for dysuria.  Skin: Negative for rash.      Objective:    BP 124/70   Pulse 73   Wt 222 lb 3.2 oz (100.8 kg)   LMP 02/13/2018   Breastfeeding? Unknown   BMI 39.36 kg/m  Physical Exam  Constitutional: She is oriented to person, place, and time. She appears well-developed and well-nourished.  HENT:  Head: Normocephalic and atraumatic.  Eyes: Pupils are equal, round, and reactive to light. No scleral icterus.  Neck: Normal range of motion. No thyromegaly present.  Cardiovascular: Normal rate, regular rhythm and intact distal pulses.  Pulmonary/Chest: Effort normal and breath sounds normal.  Abdominal: Soft. She exhibits no distension. There is no tenderness.  Neurological: She is alert and oriented to person, place, and time.  Skin: Skin is warm and dry.        Assessment & Plan:   Problem List Items Addressed This Visit      Unprioritized   History of recurrent miscarriages    ? Related to poor glycemic control--did educate about exercise, diet, avoidance of concentrated sweets, juices, sodas, and carbs.       Other Visit Diagnoses    Encounter for counseling regarding contraception    -  Primary   Desires IUD, has had unprotected intercourse today--return with 2 wks of no unprotected intercourse for insertion.      Total face-to-face time with patient: 15 minutes. Over 50% of encounter was spent on counseling and coordination of care. Return in  about 2 weeks (around 06/05/2018) for IUD placement.  Reva Bores 05/22/2018 4:12 PM

## 2018-06-05 ENCOUNTER — Ambulatory Visit (INDEPENDENT_AMBULATORY_CARE_PROVIDER_SITE_OTHER): Payer: Medicaid Other | Admitting: Family Medicine

## 2018-06-05 VITALS — BP 119/71 | HR 77 | Ht 63.0 in | Wt 226.0 lb

## 2018-06-05 DIAGNOSIS — Z3202 Encounter for pregnancy test, result negative: Secondary | ICD-10-CM | POA: Diagnosis not present

## 2018-06-05 DIAGNOSIS — Z30430 Encounter for insertion of intrauterine contraceptive device: Secondary | ICD-10-CM

## 2018-06-05 DIAGNOSIS — Z23 Encounter for immunization: Secondary | ICD-10-CM

## 2018-06-05 LAB — POCT PREGNANCY, URINE: PREG TEST UR: NEGATIVE

## 2018-06-05 MED ORDER — LEVONORGESTREL 19.5 MCG/DAY IU IUD
INTRAUTERINE_SYSTEM | Freq: Once | INTRAUTERINE | Status: AC
  Administered 2018-06-05: 1 via INTRAUTERINE

## 2018-06-05 NOTE — Patient Instructions (Signed)

## 2018-06-06 NOTE — Progress Notes (Signed)
   Subjective:    Patient ID: Sheila Carroll is a 32 y.o. female presenting with Contraception (IUD)  on 06/05/2018  HPI: Here today for IUD insertion. S/p SAB-recurrent with pre-diabetes. Desires contraception.  Review of Systems  Constitutional: Negative for chills and fever.  Respiratory: Negative for shortness of breath.   Cardiovascular: Negative for chest pain.  Gastrointestinal: Negative for abdominal pain, nausea and vomiting.  Genitourinary: Negative for dysuria.  Skin: Negative for rash.      Objective:    BP 119/71   Pulse 77   Ht 5\' 3"  (1.6 m)   Wt 226 lb (102.5 kg)   LMP 05/31/2018 (Exact Date)   Breastfeeding? No   BMI 40.03 kg/m  Physical Exam  Constitutional: She is oriented to person, place, and time. She appears well-developed and well-nourished.  HENT:  Head: Normocephalic and atraumatic.  Eyes: Pupils are equal, round, and reactive to light. No scleral icterus.  Neck: Normal range of motion. No thyromegaly present.  Cardiovascular: Normal rate, regular rhythm and intact distal pulses.  Pulmonary/Chest: Effort normal and breath sounds normal.  Abdominal: Soft. She exhibits no distension. There is no tenderness.  Neurological: She is alert and oriented to person, place, and time.  Skin: Skin is warm and dry.   Procedure: Patient identified, informed consent performed, signed copy in chart, time out was performed.  Urine pregnancy test negative.  Speculum placed in the vagina.  Cervix visualized.  Cleaned with Betadine x 2.  Grasped anteriourly with a single tooth tenaculum.  Uterus sounded to 9 cm.  Liletta IUD placed per manufacturer's recommendations.  Strings trimmed to 3 cm.   Patient given post procedure instructions and Liletta care card with expiration date.  Patient is asked to check IUD strings periodically and follow up in 4-6 weeks for IUD check.      Assessment & Plan:  Flu vaccine need - Plan: Flu Vaccine QUAD 36+ mos IM  Encounter  for IUD insertion - Plan: Levonorgestrel (LILETTA) 19.5 MCG/DAY IUD    Return in about 4 weeks (around 07/03/2018) for iud check.  Reva Boresanya S Ryian Lynde 06/06/2018 8:46 AM

## 2018-07-03 ENCOUNTER — Ambulatory Visit (INDEPENDENT_AMBULATORY_CARE_PROVIDER_SITE_OTHER): Payer: Medicaid Other | Admitting: Family Medicine

## 2018-07-03 ENCOUNTER — Encounter: Payer: Self-pay | Admitting: Family Medicine

## 2018-07-03 VITALS — BP 128/82 | HR 74 | Ht 63.0 in | Wt 221.4 lb

## 2018-07-03 DIAGNOSIS — Z30431 Encounter for routine checking of intrauterine contraceptive device: Secondary | ICD-10-CM | POA: Diagnosis not present

## 2018-07-03 NOTE — Progress Notes (Signed)
   Subjective:    Patient ID: Sheila Carroll is a 32 y.o. female presenting with IUD Check  on 07/03/2018  HPI: S/p SAB and then IUD placement. Bleeding stopped yesterday. May have had cycle. Partner is aware, but not bothered by strings.  Review of Systems  Constitutional: Negative for chills and fever.  Respiratory: Negative for shortness of breath.   Cardiovascular: Negative for chest pain.  Gastrointestinal: Negative for abdominal pain, nausea and vomiting.  Genitourinary: Negative for dysuria.  Skin: Negative for rash.      Objective:    BP 128/82   Pulse 74   Ht 5\' 3"  (1.6 m)   Wt 221 lb 6.4 oz (100.4 kg)   LMP 06/08/2018 (Within Days)   BMI 39.22 kg/m  Physical Exam Exam conducted with a chaperone present.  Constitutional:      General: She is not in acute distress.    Appearance: She is well-developed.  HENT:     Head: Normocephalic and atraumatic.  Eyes:     General: No scleral icterus. Neck:     Musculoskeletal: Neck supple.  Cardiovascular:     Rate and Rhythm: Normal rate.  Pulmonary:     Effort: Pulmonary effort is normal.  Abdominal:     Palpations: Abdomen is soft.  Genitourinary:    Cervix: Normal.     Comments: IUD strings noted Skin:    General: Skin is warm and dry.  Neurological:     Mental Status: She is alert and oriented to person, place, and time.         Assessment & Plan:  IUD check up - Doing well. continue same.   Total face-to-face time with patient: 10 minutes. Over 50% of encounter was spent on counseling and coordination of care. Return if symptoms worsen or fail to improve.  Reva Boresanya S Katrinna Travieso 07/03/2018 3:03 PM

## 2018-07-10 IMAGING — US US OB TRANSVAGINAL
1 series · 14 of 28 positions shown · non-contrast
Comparison: None for this pregnancy

CLINICAL DATA: To diffuse midline pelvic cramping x1 day with
vaginal spotting x2 days. History of 2 miscarriages over the past
year. Beta HCG is pending.

EXAM:
OBSTETRIC <14 WK US AND TRANSVAGINAL OB US
TECHNIQUE: Both transabdominal and transvaginal ultrasound examinations were
performed for complete evaluation of the gestation as well as the
maternal uterus, adnexal regions, and pelvic cul-de-sac.
Transvaginal technique was performed to assess early pregnancy.

[Series 1: us ob transvaginal · 0.15mm/px · 84 acquisitions, 14 frames shown]
[im 4/84]
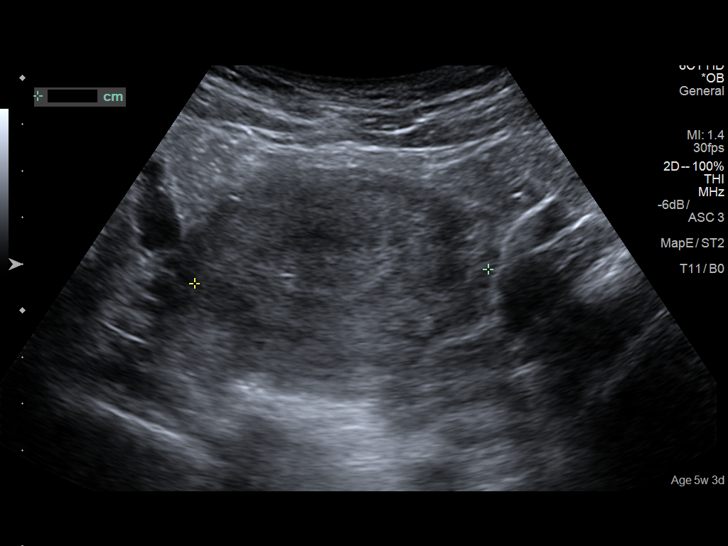
[im 10/84]
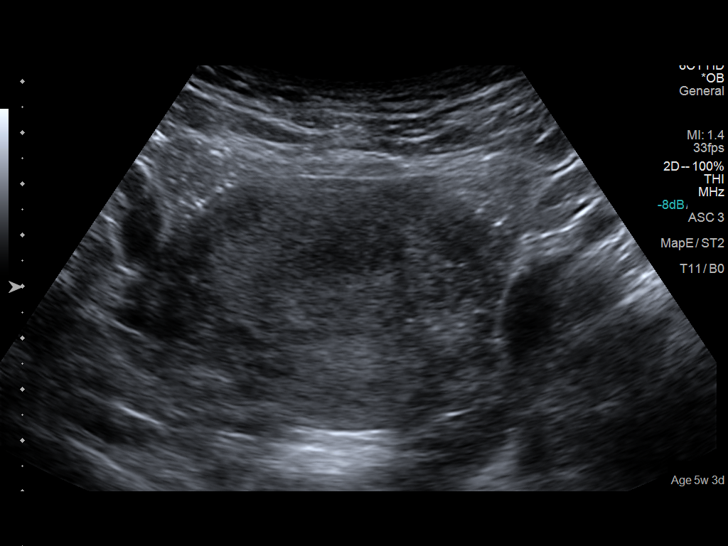
[im 16/84]
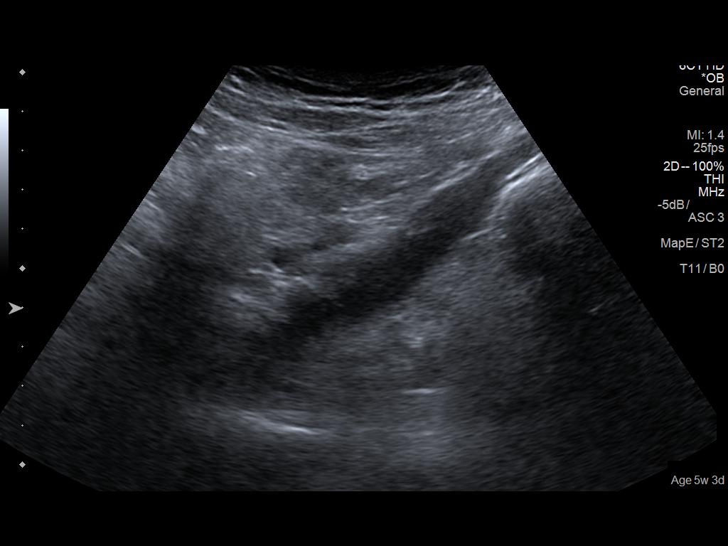
[im 22/84]
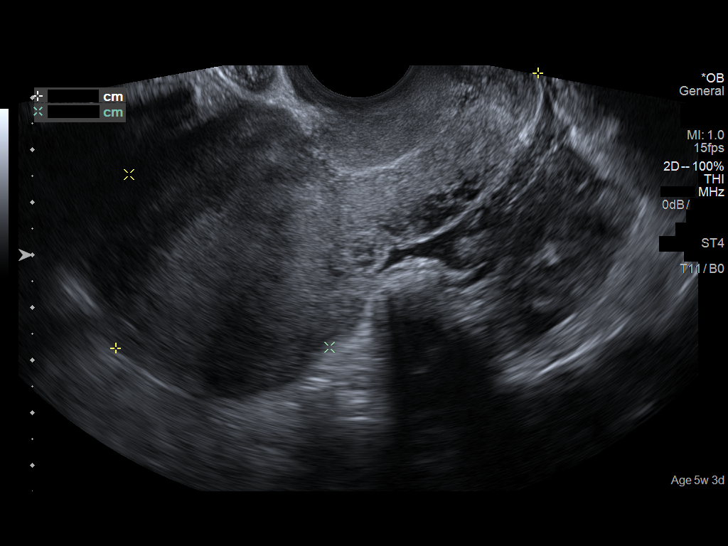
[im 28/84]
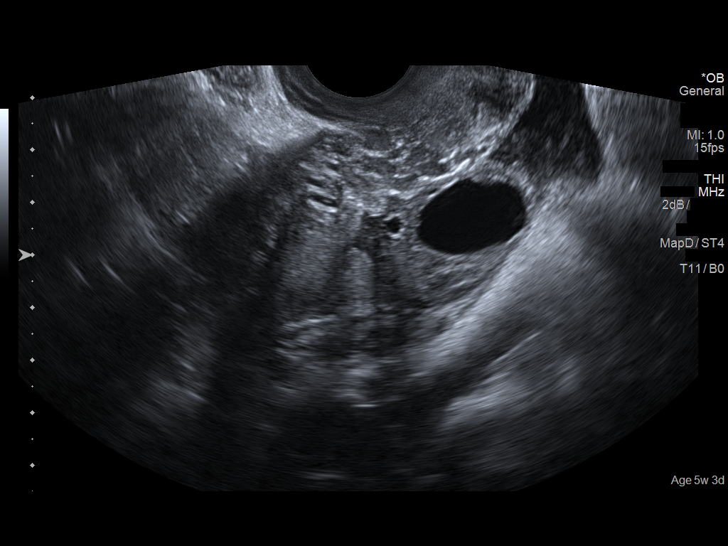
[im 34/84]
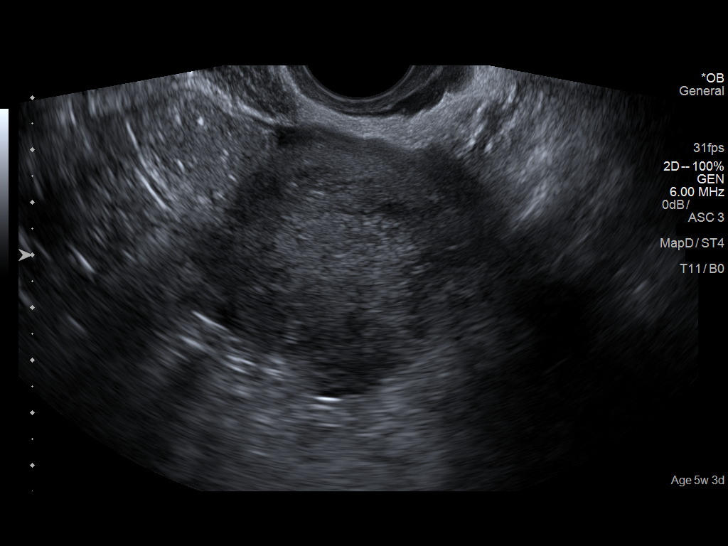
[im 40/84]
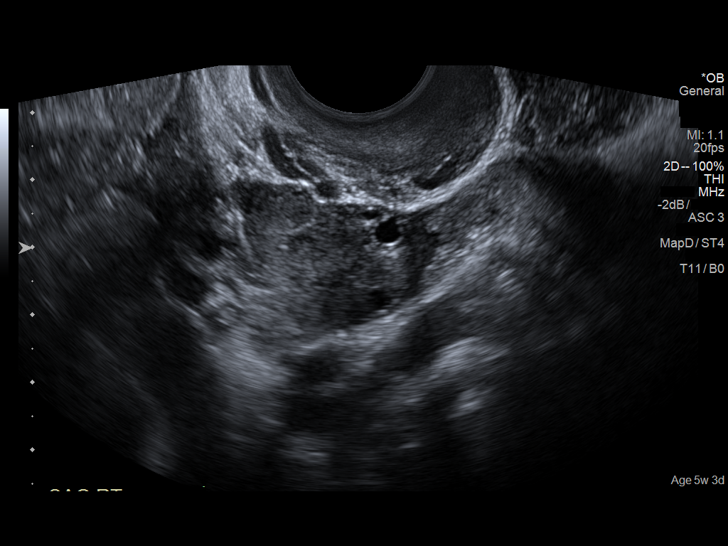
[im 47/84]
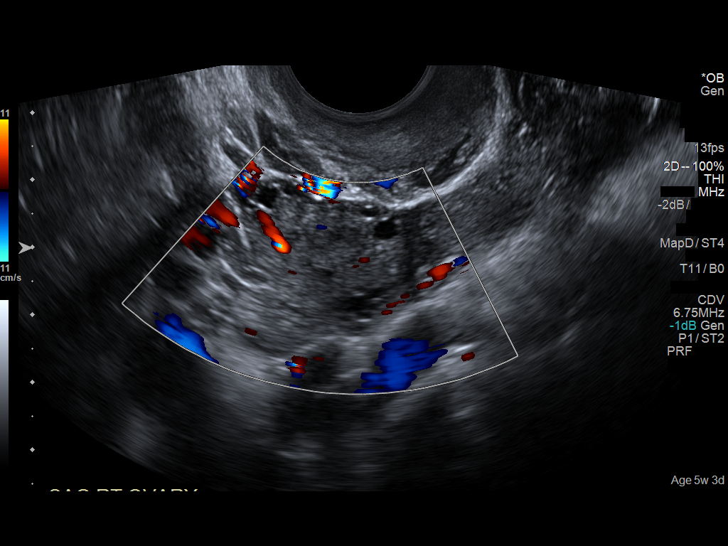
[im 53/84]
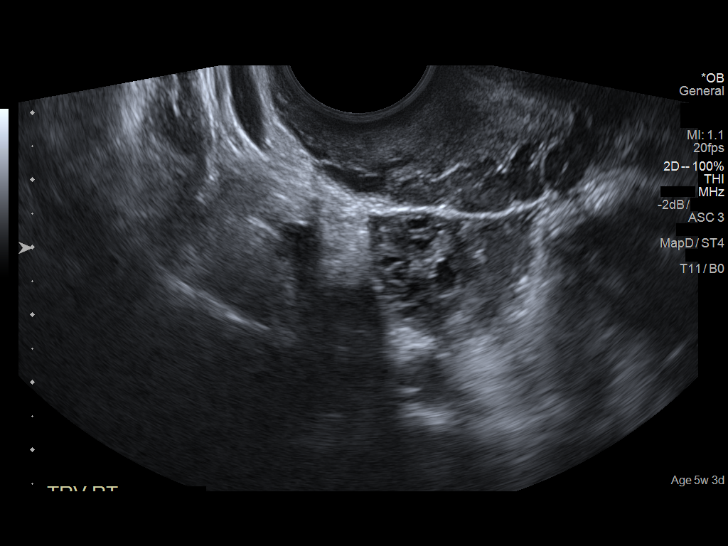
[im 59/84]
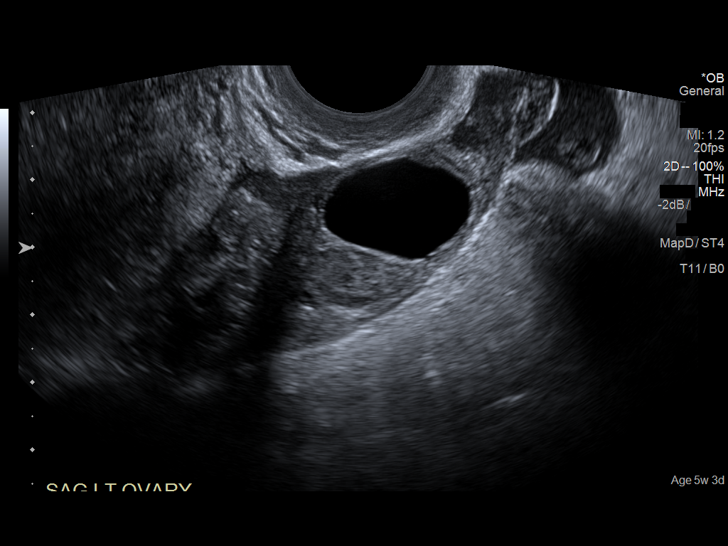
[im 65/84]
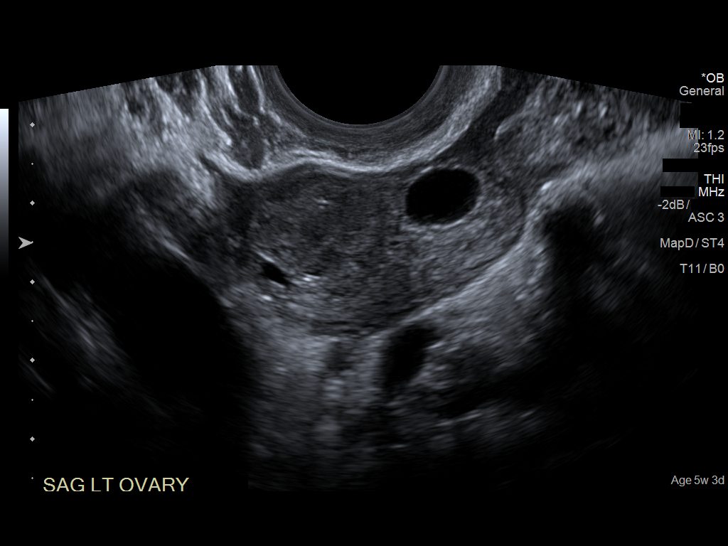
[im 71/84]
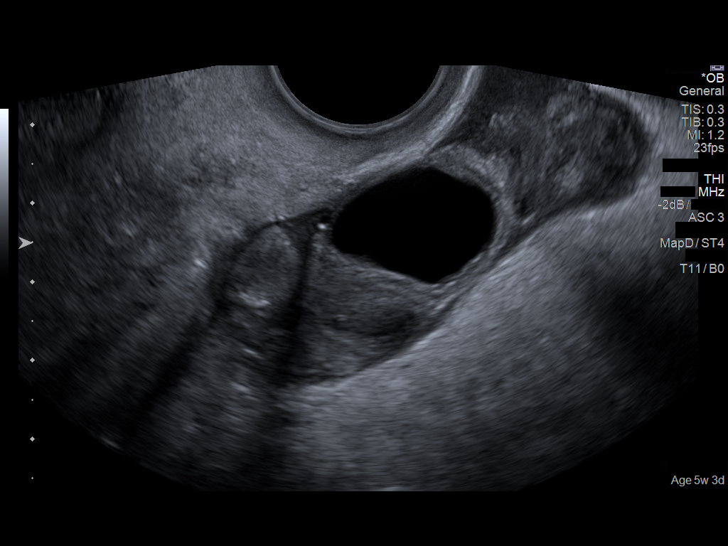
[im 77/84]
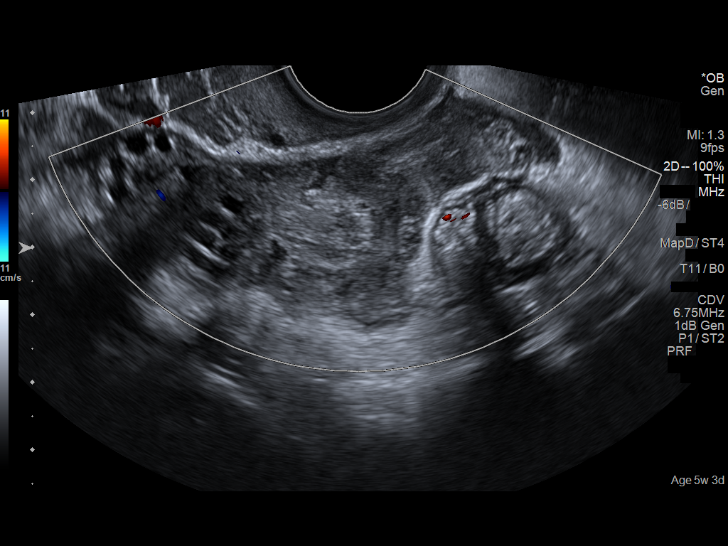
[im 84/84]
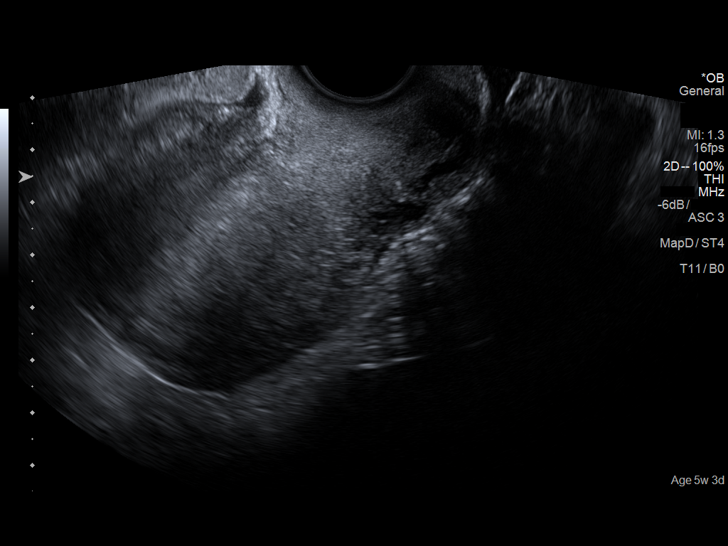

[14 of 28 positions shown; findings below may reference images not displayed]

FINDINGS: Intrauterine gestational sac: None

Yolk sac:  Not Visualized.

Embryo:  Not Visualized.

Cardiac Activity: Not Visualized.

Heart Rate: Not applicable

Subchorionic hemorrhage:  Not applicable

Maternal uterus/adnexae: Corpus luteum noted of the left ovary
measuring 1.7 x 1.4 x 1.6 cm. No ectopic pregnancy is identified.
The right ovary is normal. Endometrial stripe is 17 mm thickness. No
uterine mass is identified. Small amount of free fluid is seen about
both ovaries.
IMPRESSION: No intrauterine or ectopic pregnancy is identified.

## 2018-11-08 IMAGING — US US OB COMP LESS 14 WK
1 series · 15 of 28 positions shown · non-contrast
Comparison: 06/12/2017

CLINICAL DATA: Lower abdominal pain beginning today. Unsure of LMP.
Positive pregnancy test. Previous history of ectopic pregnancies and
miscarriages.

EXAM:
OBSTETRIC <14 WK US AND TRANSVAGINAL OB US
TECHNIQUE: Both transabdominal and transvaginal ultrasound examinations were
performed for complete evaluation of the gestation as well as the
maternal uterus, adnexal regions, and pelvic cul-de-sac.
Transvaginal technique was performed to assess early pregnancy.

[Series 1: us ob comp less 14 wk · 73 acquisitions, 15 frames shown]
[im 1/73]
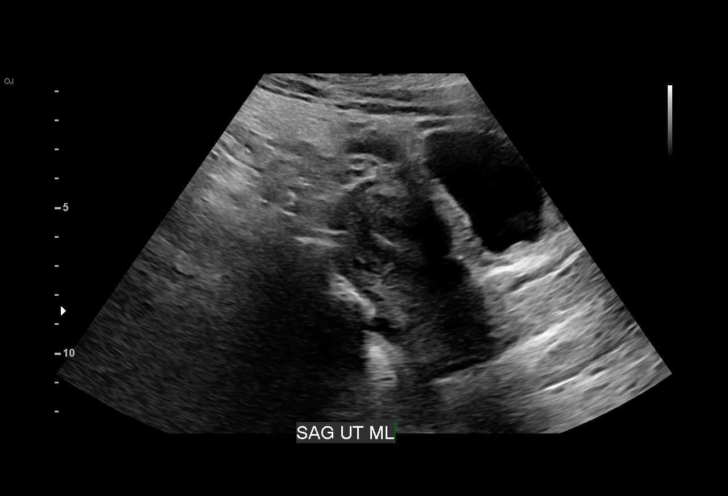
[im 6/73]
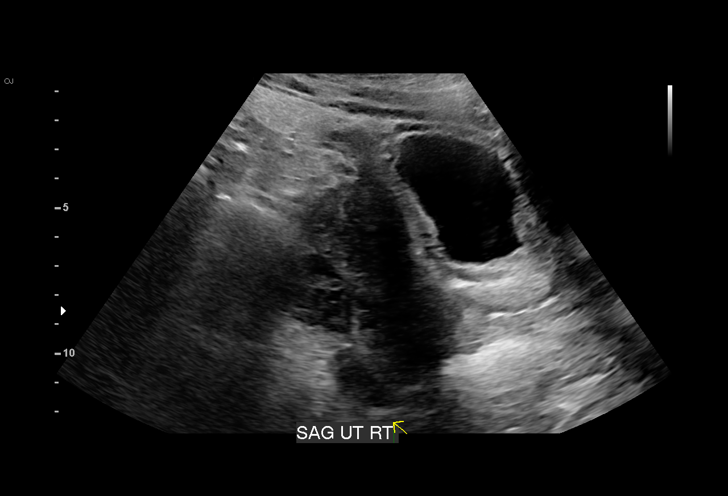
[im 11/73]
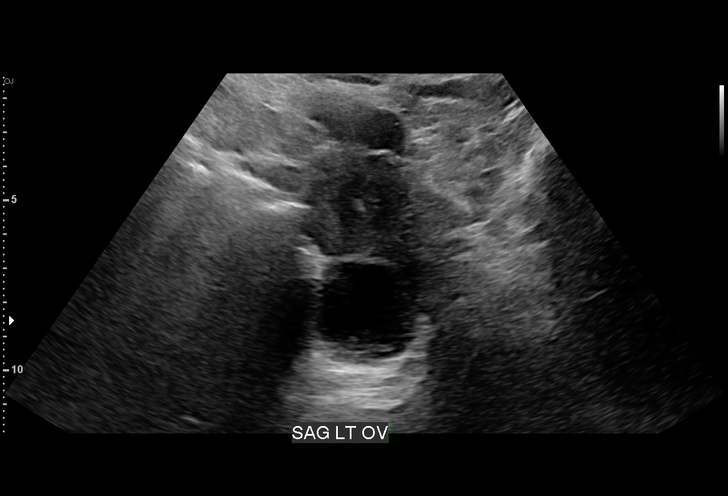
[im 17/73]
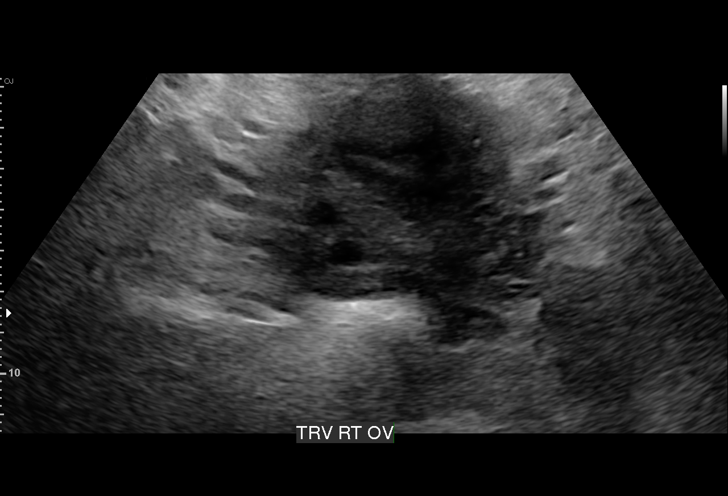
[im 22/73]
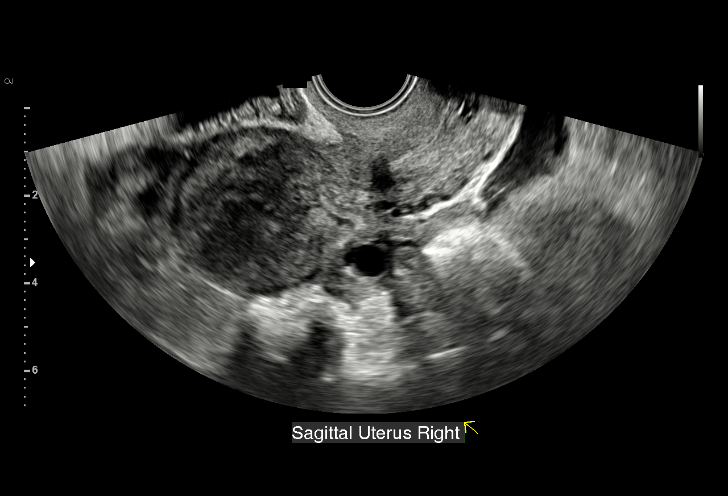
[im 27/73]
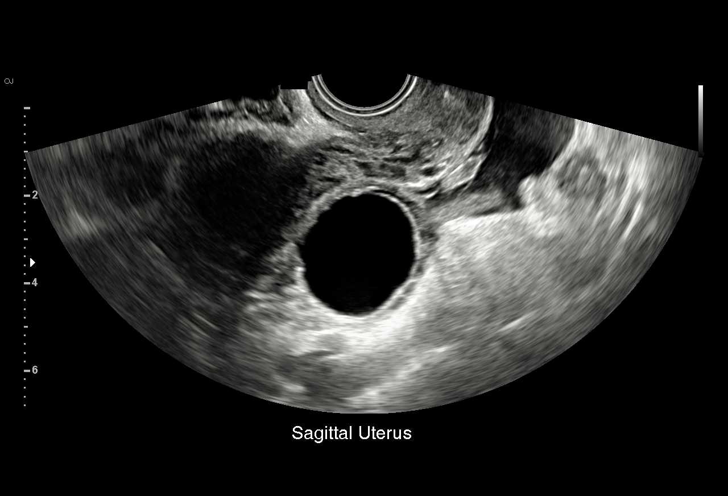
[im 33/73]
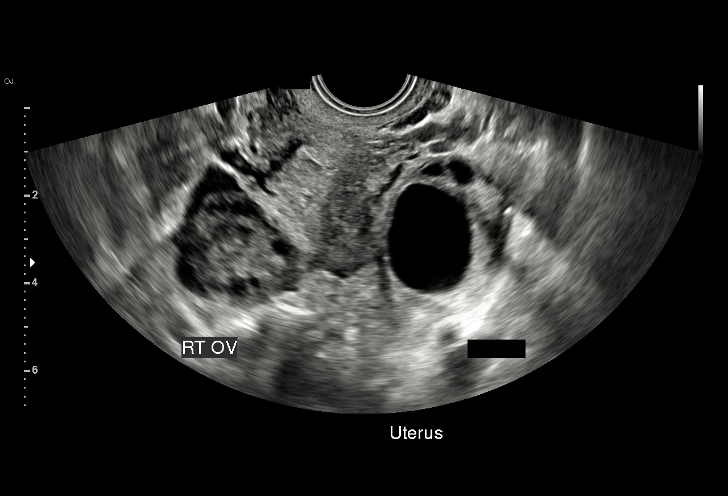
[im 38/73]
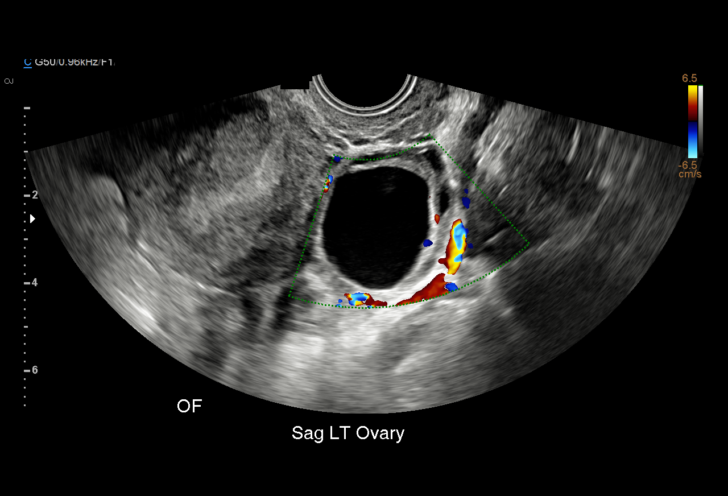
[im 41/73]
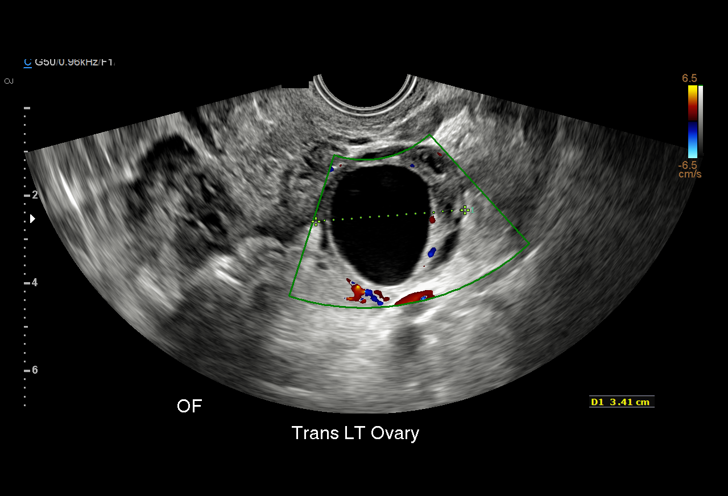
[im 46/73]
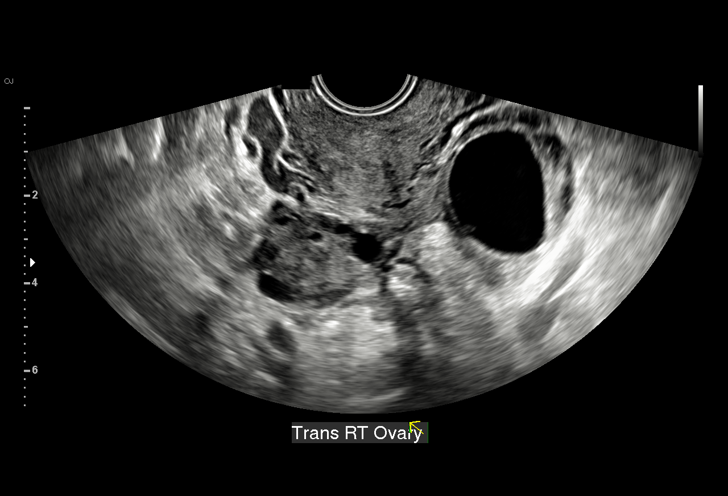
[im 51/73]
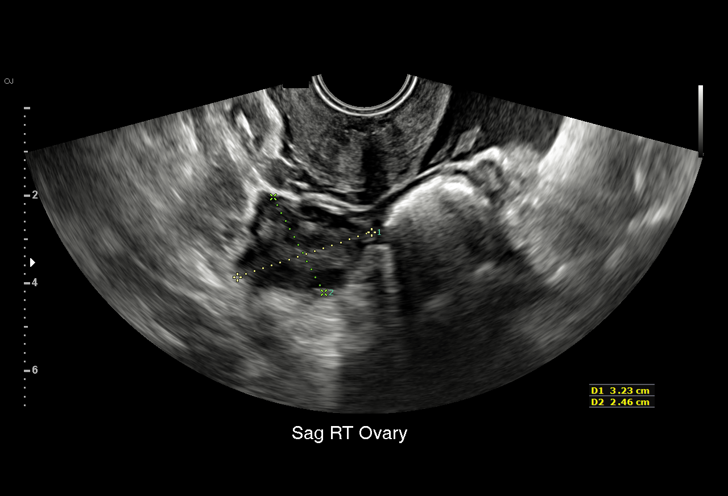
[im 57/73]
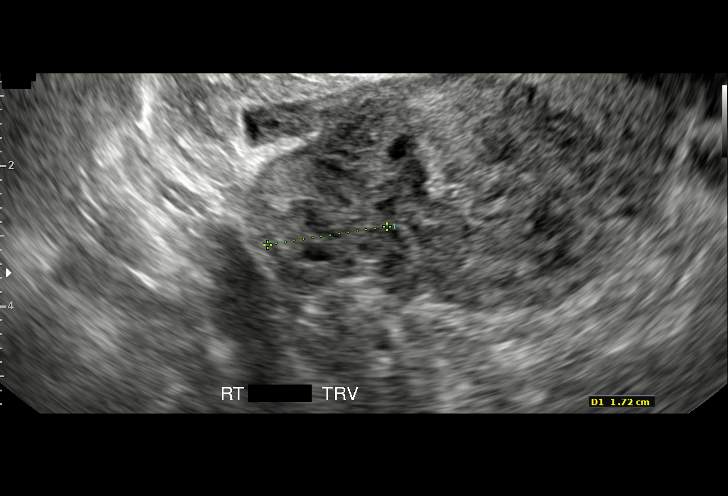
[im 62/73]
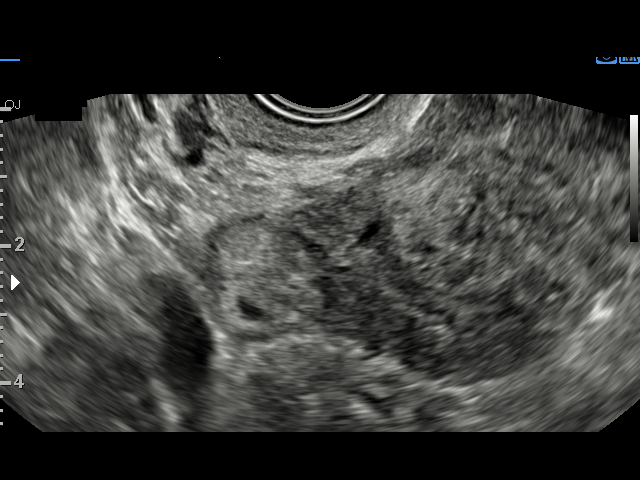
[im 67/73]
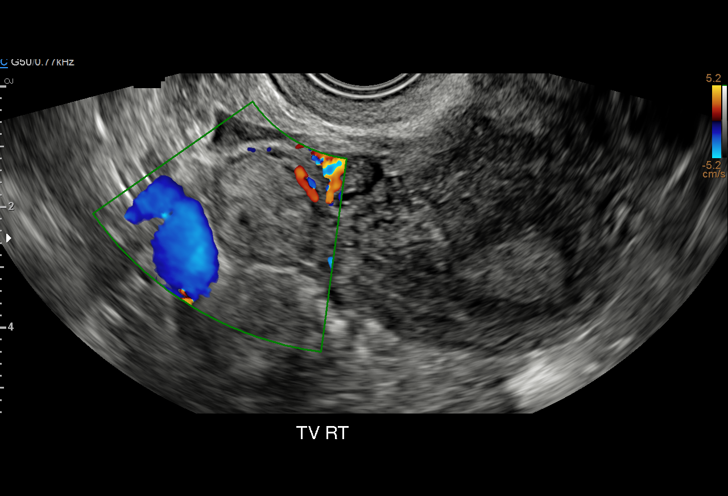
[im 73/73]
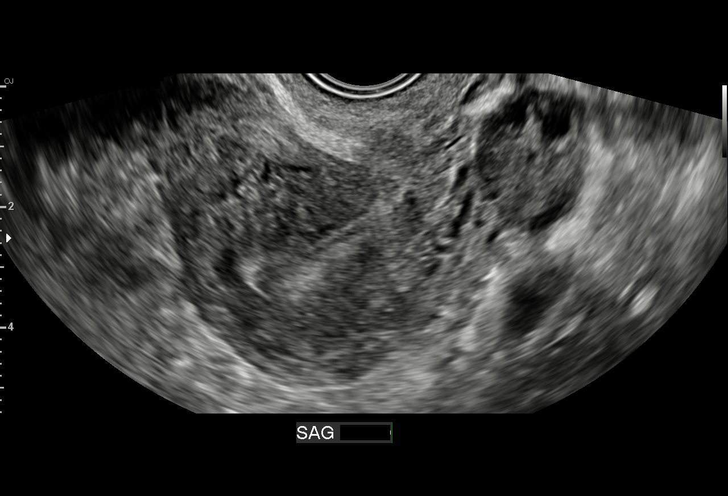

[15 of 28 positions shown; findings below may reference images not displayed]

FINDINGS: Intrauterine gestational sac: None

Maternal uterus/adnexae: No uterine fibroids identified. Both
ovaries are normal in appearance. A heterogeneous mass is seen in
the right adnexa abutting the uterine fundus and separate from the
right ovary. This measures 2.7 x 1.8 x 1.7 cm. A small amount of
echogenic free fluid is seen within the pelvic cul-de-sac,
consistent with mild hemoperitoneum.
IMPRESSION: No IUP. 2.7 cm right adnexal mass, highly suspicious for ectopic
pregnancy.

Mild hemoperitoneum in pelvic cul-de-sac.

Critical Value/emergent results were called by telephone at the time
of interpretation on 06/15/2017 at [DATE] to Dr. ABOLFAZLL GANNEDAHL , who
verbally acknowledged these results.

## 2019-09-02 IMAGING — US US OB TRANSVAGINAL
1 series · 15 of 28 positions shown · non-contrast
Comparison: 03/24/2018

CLINICAL DATA: First trimester pregnancy with inconclusive fetal
viability.

EXAM:
TRANSVAGINAL OB ULTRASOUND
TECHNIQUE: Transvaginal ultrasound was performed for complete evaluation of the
gestation as well as the maternal uterus, adnexal regions, and
pelvic cul-de-sac.

[Series 1: us ob transvaginal · 15 of 63 slices shown]
[im 1/63]
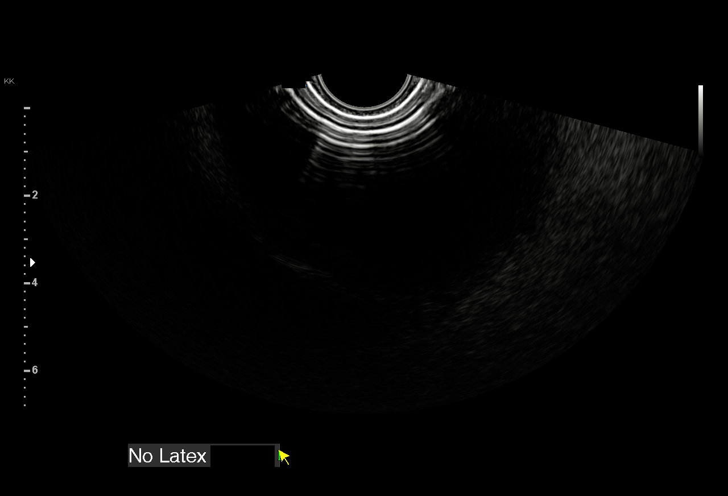
[im 5/63]
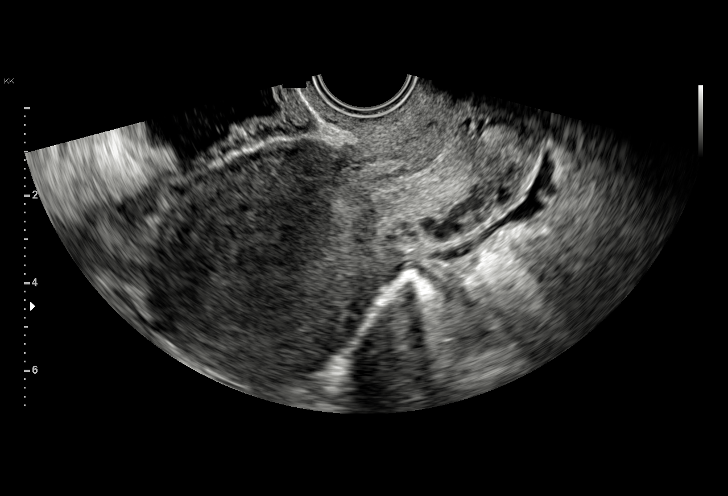
[im 10/63]
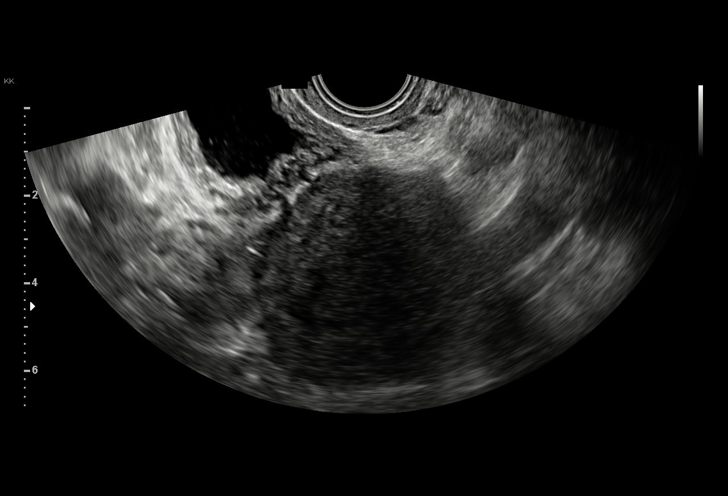
[im 14/63]
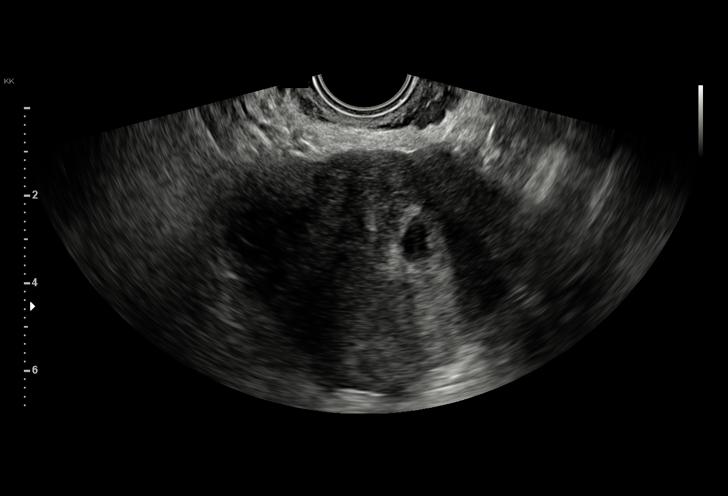
[im 19/63]
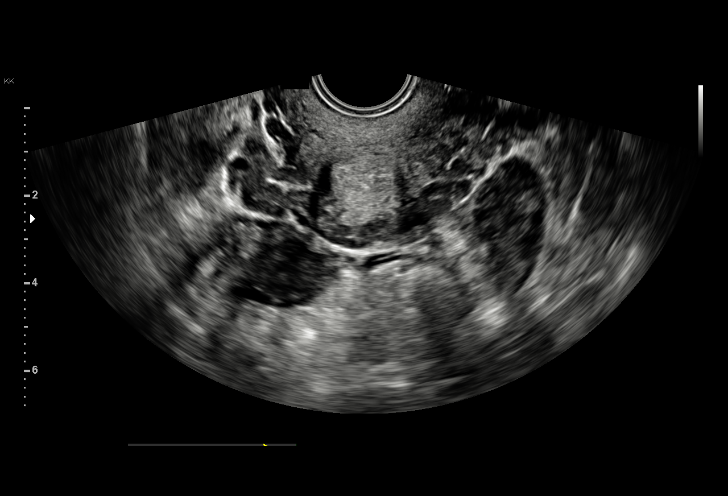
[im 23/63]
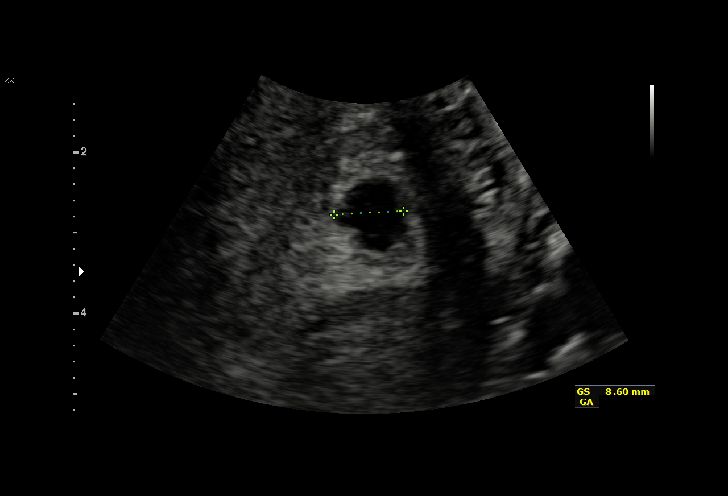
[im 28/63]
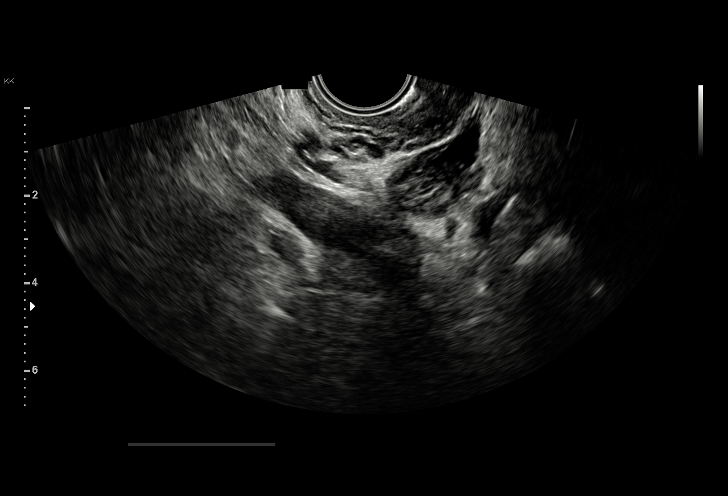
[im 33/63]
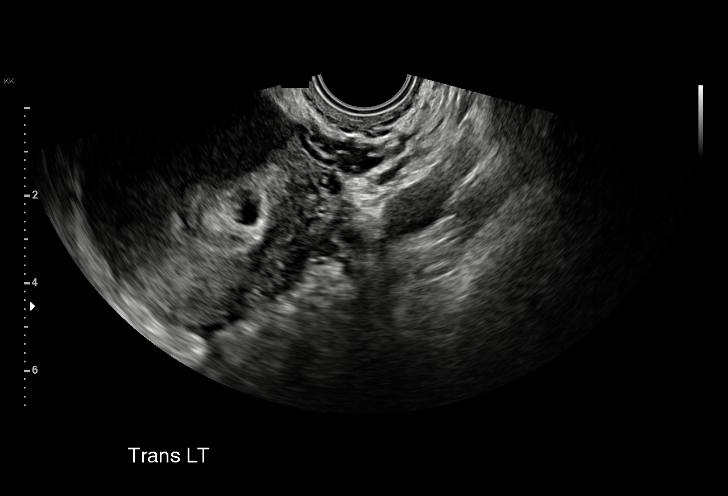
[im 35/63]
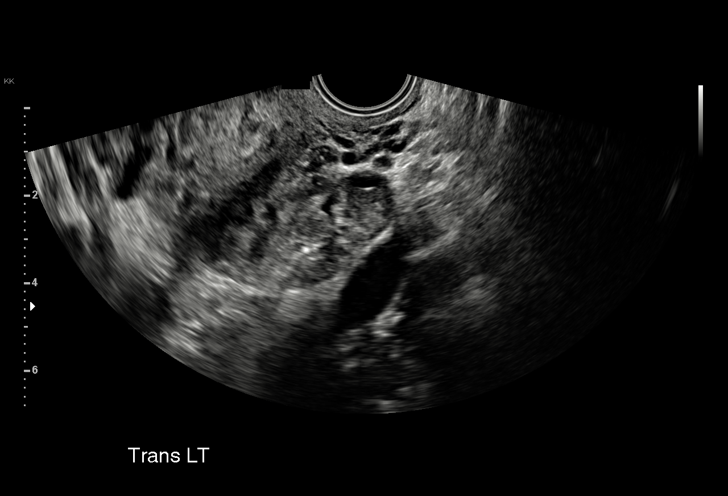
[im 40/63]
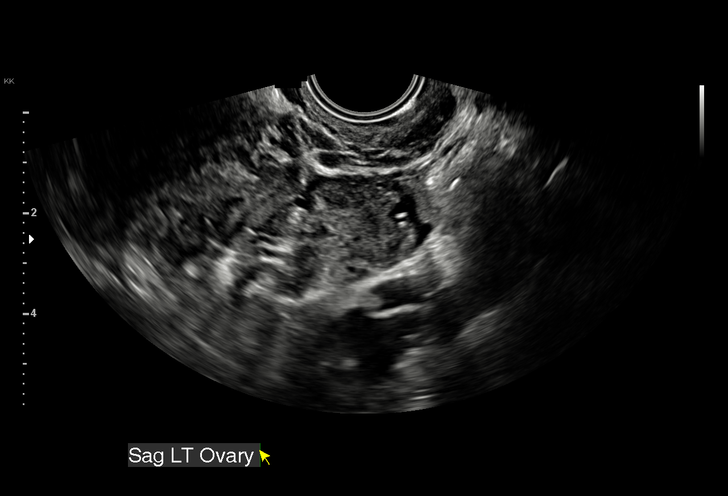
[im 44/63]
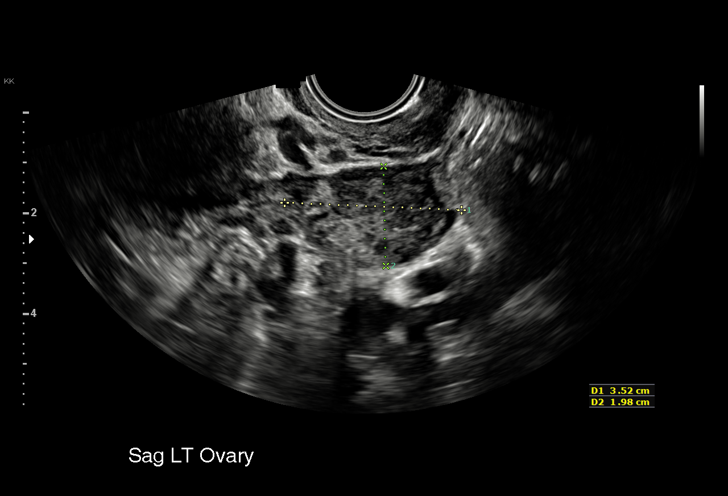
[im 49/63]
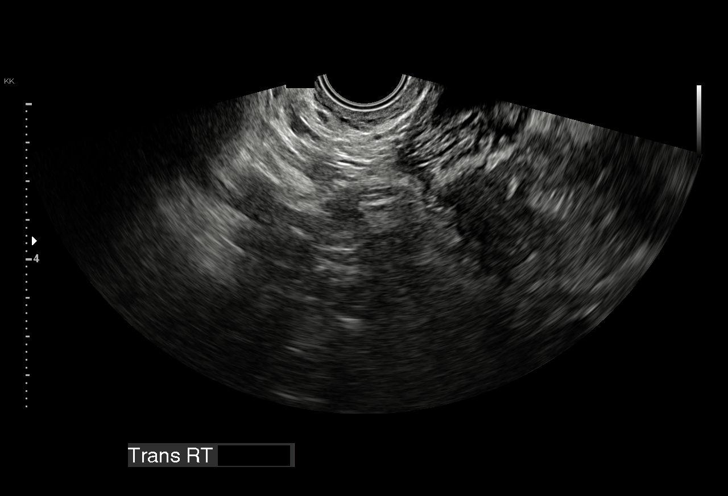
[im 53/63]
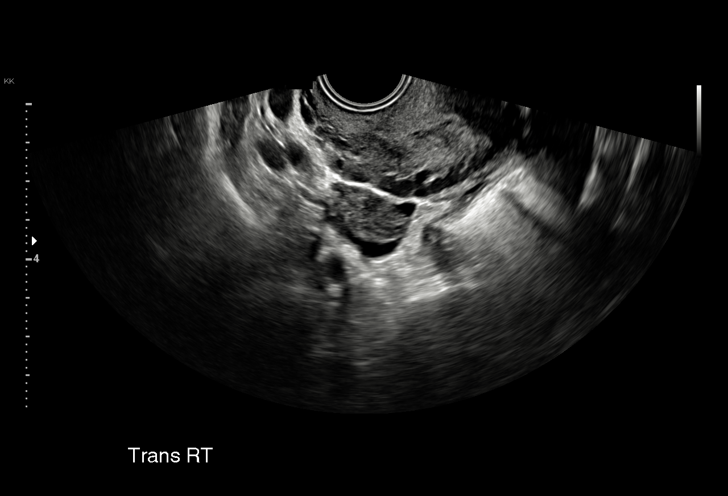
[im 58/63]
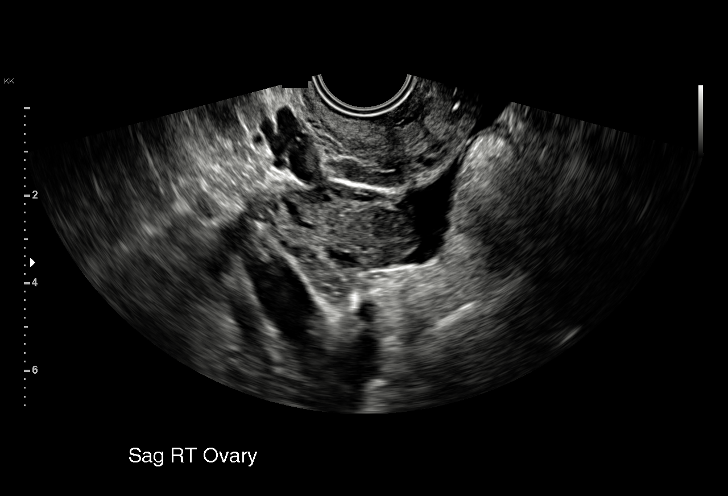
[im 63/63]
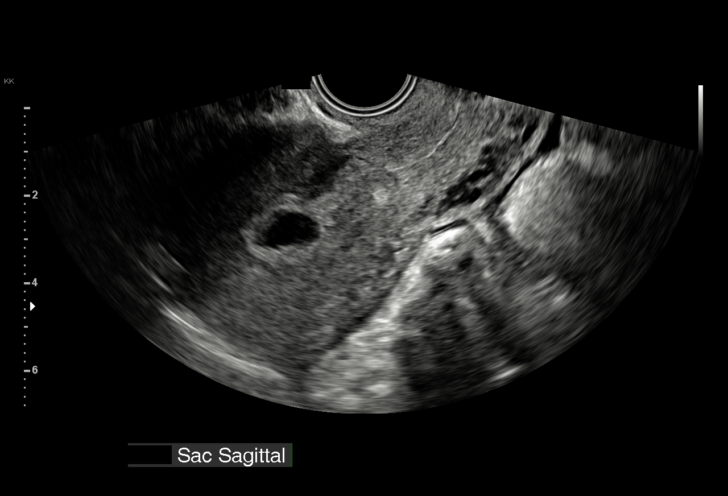

[15 of 28 positions shown; findings below may reference images not displayed]

FINDINGS: Intrauterine gestational sac: Single; irregular shape noted

Yolk sac:  Not Visualized.

Embryo:  Not Visualized.

Cardiac Activity: Not Visualized.

MSD: 11 mm   5 w   5 d

Subchorionic hemorrhage:  None visualized.

Maternal uterus/adnexae: Normal appearance of both ovaries. No
adnexal mass or abnormal free fluid identified.
IMPRESSION: Single intrauterine gestational sac measuring 5 weeks 5 days by mean
sac diameter. Irregular sac shape is a poor prognostic sign.
Consider following b-hCG levels, with followup ultrasound to assess
viability in 10-14 days.

No adnexal mass or abnormal free fluid.

## 2020-11-14 HISTORY — PX: ABDOMINAL HYSTERECTOMY: SHX81

## 2021-02-07 ENCOUNTER — Encounter: Payer: Self-pay | Admitting: Allergy

## 2021-02-07 ENCOUNTER — Ambulatory Visit (INDEPENDENT_AMBULATORY_CARE_PROVIDER_SITE_OTHER): Payer: 59 | Admitting: Allergy

## 2021-02-07 ENCOUNTER — Other Ambulatory Visit: Payer: Self-pay

## 2021-02-07 VITALS — BP 132/70 | HR 92 | Temp 98.2°F | Resp 19 | Ht 63.5 in | Wt 219.4 lb

## 2021-02-07 DIAGNOSIS — J3089 Other allergic rhinitis: Secondary | ICD-10-CM | POA: Diagnosis not present

## 2021-02-07 DIAGNOSIS — H1013 Acute atopic conjunctivitis, bilateral: Secondary | ICD-10-CM

## 2021-02-07 DIAGNOSIS — T781XXD Other adverse food reactions, not elsewhere classified, subsequent encounter: Secondary | ICD-10-CM

## 2021-02-07 DIAGNOSIS — L2089 Other atopic dermatitis: Secondary | ICD-10-CM

## 2021-02-07 DIAGNOSIS — J4520 Mild intermittent asthma, uncomplicated: Secondary | ICD-10-CM | POA: Diagnosis not present

## 2021-02-07 MED ORDER — PIMECROLIMUS 1 % EX CREA: TOPICAL_CREAM | CUTANEOUS | 3 refills | Status: DC

## 2021-02-07 MED ORDER — MONTELUKAST SODIUM 10 MG PO TABS: 10.0000 mg | ORAL_TABLET | Freq: Every day | ORAL | 4 refills | Status: DC

## 2021-02-07 MED ORDER — AZELASTINE-FLUTICASONE 137-50 MCG/ACT NA SUSP: NASAL | 3 refills | Status: DC

## 2021-02-07 NOTE — Progress Notes (Signed)
New Patient Note  RE: Sheila Carroll MRN: [id number] DOB: 17-Jun-1986 Date of Office Visit: 02/07/2021   Primary care provider: Bailey Mech, PA-C  Chief Complaint: allergies  History of present illness: Sheila Carroll is a 35 y.o. female presenting today for evaluation of rhinitis.  She has history of asthma.   She would like to have allergy testing.  She states she has symptoms of watery eyes, stuffy nose, runny nose, sneezing, ear fullness, ear itch, fatigue.  Symptoms are not occurring all the time but are year-round.   She states she usually has to take 2 benadryl and 2 tylenol to control her allergy symptoms.  She recently was treated for ear infection and feels she gets ear infections about every 3 years or so.  Last sinus infection was about 4 years ago.    Sometimes when she drinks a mixed fruit drink her mouth itches.  She states drinks with kiwi will usually cause mouth to itch.  She doesn't eat peaches as use to make her mouth itch when she was a child.    She has history of asthma that was "bad" as a child and states she has been hospitalized many times during childhood.  She states after high school her asthma seemed to "go away".  She states around 35year old she had bronchitis that led to asthma flare.  That was the last time she remembers using albuterol.  She has an albuterol inhaler "just in case.".   She has history of eczema that was worse when she was younger.  She states she does have hyperpigmentation from eczema flares.  She does have triamcinolone for eczema flares and does help.    Review of systems: Review of Systems  Constitutional: Negative.   HENT:         See HPI  Eyes:        See HPI  Respiratory: Negative.    Cardiovascular: Negative.   Gastrointestinal: Negative.   Musculoskeletal: Negative.   Skin: Negative.   Neurological: Negative.    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   Hypertension    UTI (urinary tract infection)     Past surgical history: Past Surgical History:  Procedure Laterality Date   ABDOMINAL HYSTERECTOMY Bilateral 11/2020   CESAREAN SECTION     x 2   DIAGNOSTIC LAPAROSCOPY WITH REMOVAL OF ECTOPIC PREGNANCY N/A 06/27/2016   Procedure: DIAGNOSTIC LAPAROSCOPY WITH REMOVAL OF ECTOPIC PREGNANCY;  Surgeon: Catalina Antigua, MD;  Location: [facility];  Service: Gynecology;  Laterality: N/A;   FOOT SURGERY      Family history:  Family History  Problem Relation Age of Onset   Asthma Maternal Aunt    Asthma Maternal Uncle    Asthma Paternal Aunt    Asthma Maternal Grandmother    Asthma Paternal Grandmother    Asthma Other     Social history: Lives in a house with carpeting with electric heating and central cooling.  Dog in the home.  There is no concern for water damage, mildew or roaches in the home.  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  She denies a smoking history.  Medication List: Current Outpatient Medications  Medication Sig Dispense Refill   ADDERALL XR 10 MG 24 hr capsule Take 10 mg by mouth daily.     escitalopram (LEXAPRO) 20 MG tablet Take 1 tablet by mouth daily.     JARDIANCE 25 MG TABS tablet Take 25 mg by mouth daily.     LORazepam (ATIVAN) 0.5 MG tablet Take 0.5 mg by mouth as needed.     omeprazole (PRILOSEC) 40 MG capsule Take 40 mg by mouth daily.     rosuvastatin (CRESTOR) 5 MG tablet Take 1 tablet by mouth daily.     telmisartan-hydrochlorothiazide (MICARDIS HCT) 80-12.5 MG tablet Take 1 tablet by mouth daily.     albuterol (VENTOLIN HFA) 108 (90 Base) MCG/ACT inhaler Inhale into the lungs. (Patient not taking: Reported on 02/07/2021)     aspirin  EC 81 MG tablet Take 81 mg by mouth daily. (Patient not taking: Reported on 02/07/2021)     triamcinolone cream (KENALOG) 0.5 % Apply to affected areas daily prn (Patient not taking: Reported on 02/07/2021)     No current facility-administered medications for this visit.    Known medication allergies: Allergies  Allergen Reactions   Liraglutide Other (See Comments) and Diarrhea    Fatigue Fatigue Fatigue Fatigue    Metformin Diarrhea and Nausea And Vomiting   Ramipril Cough     Physical examination: Blood pressure 132/70, pulse 92, temperature 98.2 F (36.8 C), temperature source Temporal, resp. rate 19, height 5' 3.5" (1.613 m), weight 219 lb 6.4 oz (99.5 kg), SpO2 99 %.  General: Alert, interactive, in no acute distress. HEENT: PERRLA, TMs pearly gray, turbinates mildly edematous without discharge, post-pharynx non erythematous. Neck: Supple without lymphadenopathy. Lungs: Clear to auscultation without wheezing, rhonchi or rales. {no increased work of breathing. CV: Normal S1, S2 without murmurs. Abdomen: Nondistended, nontender. Skin: Warm and dry, without lesions or rashes. Extremities:  No clubbing, cyanosis or edema. Neuro:   Grossly intact.  Diagnositics/Labs:  Spirometry: FEV1: 2.71L 56%, FVC: 1.94L 60% predicted.  Status post albuterol she had a 11% increase in FEV1 to 1.7 L 63% which is very close to being significant  Allergy testing: Environmental allergy skin prick testing is positive to grasses, weeds, trees, molds, cat and cockroach skin prick to peach is negative Allergy testing results were read and interpreted by provider, documented by clinical staff.   Assessment and plan: Asthma, mild intermittent  -lung function testing is low but did improve after albuterol use -start Singulair (see below) -have access to albuterol inhaler 2 puffs every 4-6 hours as needed for cough/wheeze/shortness of breath/chest tightness.  May use 15-20 minutes prior to  activity.   Monitor frequency of use.    Asthma control goals:  Full participation in all desired activities (may need albuterol before activity) Albuterol use two time or less a week on average (not counting use with activity) Cough interfering with sleep two time or less a month Oral steroids no more than once a year No hospitalizations  Allergic rhinitis with conjunctivitis -environmental allergy testing is positive to grasses, weeds, trees, molds, cat and cockroach -try either Allegra 180mg  or Xyzal 5mg  daily as needed.  These are long-acting antihistamine similar to Zyrtec that should be more effective  -start Singulair 10mg  daily at bedtime.  Singulair is an anti-leukotriene medication that works alongside antihistamines to have better allergy and asthma symptom control.  If you notice any change in mood/behavior/sleep after starting Singulair then stop this medication and let know.  Symptoms resolve after stopping the medication.   -reserve Benadryl use for more severe type allergy symptoms -for itchy/watery eyes use Pataday 1 drop each eye daily as needed -trial Dymista 1 spray each nostril twice a day.  This is a combination nasal spray with Flonase + Astelin (nasal antihistamine).  This helps with both nasal congestion and drainage.   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 -Most likely oral symptoms with food ingestion is related to oral allergy syndrome The oral allergy syndrome (OAS) or pollen-food allergy syndrome (PFAS) is a relatively common form of food allergy, particularly in adults. It typically occurs in people who have pollen allergies when the immune system "sees" proteins on the food that look like proteins on the pollen. This results in the allergy antibody (IgE) binding to the food instead of the pollen. Patients typically report itching and/or mild swelling of the mouth and throat immediately following ingestion of certain uncooked fruits (including  nuts) or raw vegetables. Only a very small number of affected individuals experience systemic allergic reactions, such as anaphylaxis which occurs with true food allergies.    Atopic dermatitis -keep skin moisturized after bathing -use Triamcinolone twice a day as needed for eczema flares on body -for eczema flares on face/neck, armpit or genital areas can use a non-steroid ointment like Elidel twice a day as needed  Follow-up in 3 months or sooner if needed  I appreciate the opportunity to take part in Grae's care. Please do not hesitate to contact me with questions.  Sincerely,   Margo Aye, MD Allergy/Immunology [facility]

## 2021-02-07 NOTE — Patient Instructions (Addendum)
-  lung function testing is low but did improve after albuterol use -start Singulair (see below) -have access to albuterol inhaler 2 puffs every 4-6 hours as needed for cough/wheeze/shortness of breath/chest tightness.  May use 15-20 minutes prior to activity.   Monitor frequency of use.    Asthma control goals:  Full participation in all desired activities (may need albuterol before activity) Albuterol use two time or less a week on average (not counting use with activity) Cough interfering with sleep two time or less a month Oral steroids no more than once a year No hospitalizations  -environmental allergy testing is positive to grasses, weeds, trees, molds, cat and cockroach -try either Allegra 180mg  or Xyzal 5mg  daily as needed.  These are long-acting antihistamine similar to Zyrtec that should be more effective  -start Singulair 10mg  daily at bedtime.  Singulair is an anti-leukotriene medication that works alongside antihistamines to have better allergy and asthma symptom control.  If you notice any change in mood/behavior/sleep after starting Singulair then stop this medication and let know.  Symptoms resolve after stopping the medication.   -reserve Benadryl use for more severe type allergy symptoms -for itchy/watery eyes use Pataday 1 drop each eye daily as needed -trial Dymista 1 spray each nostril twice a day.  This is a combination nasal spray with Flonase + Astelin (nasal antihistamine).  This helps with both nasal congestion and drainage.   -Skin test to peach is negative. -Most likely oral symptoms with food ingestion is related to oral allergy syndrome The oral allergy syndrome (OAS) or pollen-food allergy syndrome (PFAS) is a relatively common form of food allergy, particularly in adults. It typically occurs in people who have pollen allergies when the immune system "sees" proteins on the food that look like proteins on the pollen. This results in the allergy antibody (IgE)  binding to the food instead of the pollen. Patients typically report itching and/or mild swelling of the mouth and throat immediately following ingestion of certain uncooked fruits (including nuts) or raw vegetables. Only a very small number of affected individuals experience systemic allergic reactions, such as anaphylaxis which occurs with true food allergies.       -keep skin moisturized after bathing -use Triamcinolone twice a day as needed for eczema flares on body -for eczema flares on face/neck, armpit or genital areas can use a non-steroid ointment like Elidel twice a day as needed  Follow-up in 3 months or sooner if needed

## 2021-02-08 ENCOUNTER — Telehealth: Payer: Self-pay

## 2021-02-08 MED ORDER — PIMECROLIMUS 1 % EX CREA
TOPICAL_CREAM | Freq: Two times a day (BID) | CUTANEOUS | 1 refills | Status: DC
Start: 2021-02-08 — End: 2021-05-11

## 2021-02-08 NOTE — Telephone Encounter (Signed)
Sent in brand elidel as it is preferred

## 2021-02-08 NOTE — Telephone Encounter (Signed)
Pa submitted thru cover my meds for elidel

## 2021-02-11 ENCOUNTER — Other Ambulatory Visit: Payer: Self-pay | Admitting: *Deleted

## 2021-02-11 MED ORDER — FLUTICASONE PROPIONATE 50 MCG/ACT NA SUSP
2.0000 | Freq: Every day | NASAL | 5 refills | Status: DC | PRN
Start: 2021-02-11 — End: 2021-11-10

## 2021-02-11 MED ORDER — AZELASTINE HCL 0.1 % NA SOLN: 1.0000 | Freq: Two times a day (BID) | NASAL | 5 refills | Status: DC | PRN

## 2021-02-11 NOTE — Telephone Encounter (Signed)
Deniedon July 26 Request Reference Number: [id number]. ELIDEL CRE 1% is denied for not meeting the prior authorization requirement(s). Details of this decision are in the notice attached below or have been faxed to you. Appeals are not supported through [employer]. Please refer to the fax case notice for appeals information and instructions.

## 2021-02-11 NOTE — Telephone Encounter (Signed)
Dymista not covered. Split dose sent of fluticasone and azelastine.

## 2021-05-11 ENCOUNTER — Encounter: Payer: Self-pay | Admitting: Allergy

## 2021-05-11 ENCOUNTER — Other Ambulatory Visit: Payer: Self-pay

## 2021-05-11 ENCOUNTER — Ambulatory Visit (INDEPENDENT_AMBULATORY_CARE_PROVIDER_SITE_OTHER): Payer: 59 | Admitting: Allergy

## 2021-05-11 VITALS — BP 154/100 | HR 80 | Temp 98.4°F | Resp 16

## 2021-05-11 DIAGNOSIS — J3089 Other allergic rhinitis: Secondary | ICD-10-CM | POA: Diagnosis not present

## 2021-05-11 DIAGNOSIS — H1013 Acute atopic conjunctivitis, bilateral: Secondary | ICD-10-CM | POA: Diagnosis not present

## 2021-05-11 DIAGNOSIS — T781XXD Other adverse food reactions, not elsewhere classified, subsequent encounter: Secondary | ICD-10-CM

## 2021-05-11 DIAGNOSIS — J4520 Mild intermittent asthma, uncomplicated: Secondary | ICD-10-CM

## 2021-05-11 DIAGNOSIS — L2089 Other atopic dermatitis: Secondary | ICD-10-CM | POA: Diagnosis not present

## 2021-05-11 MED ORDER — ALBUTEROL SULFATE HFA 108 (90 BASE) MCG/ACT IN AERS: 2.0000 | INHALATION_SPRAY | RESPIRATORY_TRACT | 1 refills | Status: DC | PRN

## 2021-05-11 NOTE — Progress Notes (Signed)
Follow-up Note  RE: Lenya Sterne MRN: [id number] DOB: 19-Jan-1986 Date of Office Visit: 05/11/2021   History of present illness: Sheila Carroll is a 35 y.o. female presenting today for follow-up of asthma, allergic rhinitis with conjunctivitis, pollen food allergy syndrome, eczema.  She was last seen in the office on 02/07/21 by myself.  She states she is doing well without any major health changes, surgeries or hospitalizations. She feels like her asthma has been doing better.  However she states her asthma and allergies tend to flare all at the same time usually around the change from fall into winter.  That she feels like it may be too soon to determine if the singular medication that she started after her last visit is helping.  She is taking the Singulair daily.  She has not needed to use her albuterol.  Has not had any ED or urgent care visits or any systemic steroid needs. Again she is not sure if the Singulair is helpful yet with her allergy symptom control.  She at this point only reports occasional runny nose.  She does have both Flonase and Astelin that she uses together as needed and states it has been helpful when she does use it.  She is still currently taking Zyrtec.  She has not needed to use the eyedrop.  She is possibly interested in allergen immunotherapy if her insurance covers it. She does continue to avoid certain foods that have caused oral symptoms with ingestion. She states she is noticing some more itching and scaliness of her eyebrow area and forehead near the scalp line.  She states she does have the Elidel but did not know that it was intended to be used for eczema control on the face.  She states she has been moisturizing well especially the face.  Review of systems: Review of Systems  Constitutional: Negative.   HENT: Negative.    Eyes: Negative.   Respiratory: Negative.    Cardiovascular: Negative.   Gastrointestinal: Negative.   Musculoskeletal: Negative.    Skin:  Positive for itching and rash.  Neurological: Negative.    All other systems negative unless noted above in HPI  Past medical/social/surgical/family history have been reviewed and are unchanged unless specifically indicated below.  No changes  Medication List: Current Outpatient Medications  Medication Sig Dispense Refill   ADDERALL XR 10 MG 24 hr capsule Take 10 mg by mouth daily.     albuterol (VENTOLIN HFA) 108 (90 Base) MCG/ACT inhaler Inhale into the lungs.     azelastine (ASTELIN) 0.1 % nasal spray Place 1 spray into both nostrils 2 (two) times daily as needed. 30 mL 5   escitalopram (LEXAPRO) 20 MG tablet Take 1 tablet by mouth daily.     fluconazole (DIFLUCAN) 150 MG tablet Take 150 mg by mouth once. Just prescribed yesterday     fluticasone (FLONASE) 50 MCG/ACT nasal spray Place 2 sprays into both nostrils daily as needed for allergies or rhinitis. 16 mL 5   JARDIANCE 25 MG TABS tablet Take 25 mg by mouth daily.     LORazepam (ATIVAN) 0.5 MG tablet Take 0.5 mg by mouth as needed.     montelukast (SINGULAIR) 10 MG tablet Take 1 tablet (10 mg total) by mouth at bedtime. 30 tablet 4   nitrofurantoin, macrocrystal-monohydrate, (MACROBID) 100 MG capsule Take 100 mg by mouth 2 (two) times daily. Just prescribed yesterday     omeprazole (PRILOSEC) 40 MG capsule Take 40 mg by mouth daily.  pimecrolimus (ELIDEL) 1 % cream Apply to face/neck, armpit or genital areas can use a non-steroid ointment for eczema flare ups. 30 g 3   rosuvastatin (CRESTOR) 5 MG tablet Take 1 tablet by mouth daily.     telmisartan (MICARDIS) 80 MG tablet Take 80 mg by mouth daily.     tiZANidine (ZANAFLEX) 4 MG tablet Take by mouth.     triamcinolone cream (KENALOG) 0.5 %      No current facility-administered medications for this visit.     Known medication allergies: Allergies  Allergen Reactions   Liraglutide Other (See Comments) and Diarrhea    Fatigue Fatigue Fatigue Fatigue     Metformin Diarrhea and Nausea And Vomiting   Ramipril Cough     Physical examination: Blood pressure (!) 154/100, pulse 80, temperature 98.4 F (36.9 C), temperature source Temporal, resp. rate 16, SpO2 98 %.  General: Alert, interactive, in no acute distress. HEENT: PERRLA, TMs pearly gray, turbinates minimally edematous without discharge, post-pharynx non erythematous. Neck: Supple without lymphadenopathy. Lungs: Clear to auscultation without wheezing, rhonchi or rales. {no increased work of breathing. CV: Normal S1, S2 without murmurs. Abdomen: Nondistended, nontender. Skin: Warm and dry, without lesions or rashes. Extremities:  No clubbing, cyanosis or edema. Neuro:   Grossly intact.  Diagnositics/Labs:  Spirometry: FEV1: 1.57L 58%, FVC: 2.24L 70% predicted.  This is a slight improvement from previous study  Assessment and plan: Mild intermittent asthma  -lung function testing is improved today -continue Singulair  -have access to albuterol inhaler 2 puffs every 4-6 hours as needed for cough/wheeze/shortness of breath/chest tightness.  May use 15-20 minutes prior to activity.   Monitor frequency of use.    Asthma control goals:  Full participation in all desired activities (may need albuterol before activity) Albuterol use two time or less a week on average (not counting use with activity) Cough interfering with sleep two time or less a month Oral steroids no more than once a year No hospitalizations  Allergic rhinitis with conjunctivitis -continue avoidance measures for grasses, weeds, trees, molds, cat and cockroach -try Xyzal 5mg  daily as needed.  this is a long-acting antihistamine similar to Zyrtec that should be more effective  -continue Singulair 10mg  daily at bedtime.   -reserve Benadryl use for more severe type allergy symptoms -for itchy/watery eyes use Pataday 1 drop each eye daily as needed -use Flonase 2 sprays each nostril daily for 1-2 weeks at a time  before stopping once nasal congestion improves for maximum benefit -use Astelin 2 sprays each nostril twice a day as needed for nasal drainage -consider allergen immunotherapy (allergy shots) if medication management is not effective enough in controlling allergy symptoms.  Provided with insurance codes you can call your insurance carrier with to check your coverage.   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 It typically occurs in people who have pollen allergies when the immune system "sees" proteins on the food that look like proteins on the pollen. This results in the allergy antibody (IgE) binding to the food instead of the pollen. Patients typically report itching and/or mild swelling of the mouth and throat immediately following ingestion of certain uncooked fruits (including nuts) or raw vegetables. Only a very small number of affected individuals experience systemic allergic reactions, such as anaphylaxis  which occurs with true food allergies.    Atopic dermatitis -keep skin moisturized after bathing -use Triamcinolone twice a day as needed for eczema flares on body.  This is a steroid ointment (do not use on face, armpits or genitalia area) -for eczema flares on face/neck, armpit or genital areas can use a non-steroid ointment Elidel twice a day as needed  Follow-up in 6 months or sooner if needed  I appreciate the opportunity to take part in Naleah's care. Please do not hesitate to contact me with questions.  Sincerely,   Margo Aye, MD Allergy/Immunology [facility]

## 2021-05-11 NOTE — Patient Instructions (Addendum)
-  lung function testing is improved today -continue Singulair  -have access to albuterol inhaler 2 puffs every 4-6 hours as needed for cough/wheeze/shortness of breath/chest tightness.  May use 15-20 minutes prior to activity.   Monitor frequency of use.    Asthma control goals:  Full participation in all desired activities (may need albuterol before activity) Albuterol use two time or less a week on average (not counting use with activity) Cough interfering with sleep two time or less a month Oral steroids no more than once a year No hospitalizations  -continue avoidance measures for grasses, weeds, trees, molds, cat and cockroach -try Xyzal 5mg  daily as needed.  this is a long-acting antihistamine similar to Zyrtec that should be more effective  -continue Singulair 10mg  daily at bedtime.   -reserve Benadryl use for more severe type allergy symptoms -for itchy/watery eyes use Pataday 1 drop each eye daily as needed -use Flonase 2 sprays each nostril daily for 1-2 weeks at a time before stopping once nasal congestion improves for maximum benefit -use Astelin 2 sprays each nostril twice a day as needed for nasal drainage -consider allergen immunotherapy (allergy shots) if medication management is not effective enough in controlling allergy symptoms.  Provided with insurance codes you can call your insurance carrier with to check your coverage.    -Skin test to peach was negative. 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 It typically occurs in people who have pollen allergies when the immune system "sees" proteins on the food that look like proteins on the pollen. This results in the allergy antibody (IgE) binding to the food instead of the pollen. Patients typically report itching and/or mild swelling of the mouth and  throat immediately following ingestion of certain uncooked fruits (including nuts) or raw vegetables. Only a very small number of affected individuals experience systemic allergic reactions, such as anaphylaxis which occurs with true food allergies.       -keep skin moisturized after bathing -use Triamcinolone twice a day as needed for eczema flares on body.  This is a steroid ointment (do not use on face, armpits or genitalia area) -for eczema flares on face/neck, armpit or genital areas can use a non-steroid ointment Elidel twice a day as needed  Follow-up in 6 months or sooner if needed

## 2021-11-10 ENCOUNTER — Ambulatory Visit (INDEPENDENT_AMBULATORY_CARE_PROVIDER_SITE_OTHER): Payer: 59 | Admitting: Internal Medicine

## 2021-11-10 ENCOUNTER — Ambulatory Visit: Payer: 59 | Admitting: Internal Medicine

## 2021-11-10 ENCOUNTER — Encounter: Payer: Self-pay | Admitting: Internal Medicine

## 2021-11-10 VITALS — BP 138/88 | HR 78 | Temp 98.1°F

## 2021-11-10 DIAGNOSIS — L2089 Other atopic dermatitis: Secondary | ICD-10-CM

## 2021-11-10 DIAGNOSIS — T781XXD Other adverse food reactions, not elsewhere classified, subsequent encounter: Secondary | ICD-10-CM

## 2021-11-10 DIAGNOSIS — J309 Allergic rhinitis, unspecified: Secondary | ICD-10-CM | POA: Insufficient documentation

## 2021-11-10 DIAGNOSIS — J4520 Mild intermittent asthma, uncomplicated: Secondary | ICD-10-CM | POA: Diagnosis not present

## 2021-11-10 DIAGNOSIS — T781XXA Other adverse food reactions, not elsewhere classified, initial encounter: Secondary | ICD-10-CM | POA: Insufficient documentation

## 2021-11-10 DIAGNOSIS — J3089 Other allergic rhinitis: Secondary | ICD-10-CM

## 2021-11-10 DIAGNOSIS — H1013 Acute atopic conjunctivitis, bilateral: Secondary | ICD-10-CM

## 2021-11-10 MED ORDER — TRIAMCINOLONE ACETONIDE 0.5 % EX CREA: TOPICAL_CREAM | Freq: Two times a day (BID) | CUTANEOUS | 3 refills | Status: DC | PRN

## 2021-11-10 MED ORDER — AZELASTINE HCL 0.1 % NA SOLN: 1.0000 | Freq: Two times a day (BID) | NASAL | 5 refills | Status: AC | PRN

## 2021-11-10 MED ORDER — MONTELUKAST SODIUM 10 MG PO TABS: 10.0000 mg | ORAL_TABLET | Freq: Every day | ORAL | 5 refills | Status: DC

## 2021-11-10 MED ORDER — ALBUTEROL SULFATE HFA 108 (90 BASE) MCG/ACT IN AERS: 2.0000 | INHALATION_SPRAY | RESPIRATORY_TRACT | 1 refills | Status: DC | PRN

## 2021-11-10 MED ORDER — PIMECROLIMUS 1 % EX CREA
TOPICAL_CREAM | CUTANEOUS | 3 refills | Status: DC
Start: 2021-11-10 — End: 2022-05-12

## 2021-11-10 MED ORDER — FLUTICASONE PROPIONATE 50 MCG/ACT NA SUSP: 2.0000 | Freq: Every day | NASAL | 5 refills | Status: AC | PRN

## 2021-11-10 NOTE — Progress Notes (Signed)
? ?FOLLOW UP ?Date of Service/Encounter:  11/10/21 ? ? ?Subjective:  ?Sheila Carroll (DOB: 06-17-1986) is a 36 y.o. female who [facility] in re-evaluation of the following: asthma, allergic rhinitis with conjunctivitis, pollen food allergy syndrome, eczema ?History obtained from: chart review and patient. ? ?For Review, LV was on 05/11/21  with Dr. Delorse Lek seen for regular follow-up.  FEV1 58% at last visit.  She was interested in allergy injections and was given information at last visit.  She has not started injections. ? ?Pertinent History/Diagnostics:  ?- Asthma: Intermittent ?- Allergic Rhinitis:  ? - SPT environmental panel (2022): +grasses, weeds, trees, molds, cat and cockroach ?- Food Allergy (peach, kiwi) ? - Hx of reaction: "mixed fruit drink her mouth itches.  She states drinks with kiwi will usually cause mouth to itch.  She doesn't eat peaches as use to make her mouth itch when she was a child." ? - SPT select foods (2022): Negative to peach  ? ?Today presents for follow-up. ?She is doing well. ?Did have to take allergy meds at the season change, but is able to use them as needed. Takes singulair most nights out of the week.  Uses nasal sprays and eye drops as needed.  ? ?Eczema has not been flared recently, but does use topical creams usually with significant temperature changes.   ? ?Using rescue inhaler very infrequently. Doesn't remember the last time she used it.  ? ? ? ?Allergies as of 11/10/2021   ? ?   Reactions  ? Liraglutide Other (See Comments), Diarrhea  ? Fatigue ?Fatigue ?Fatigue ?Fatigue  ? Metformin Diarrhea, Nausea And Vomiting  ? Ramipril Cough  ? ?  ? ?  ?Medication List  ?  ? ?  ? Accurate as of November 10, 2021  5:25 PM. If you have any questions, ask your nurse or doctor.  ?  ?  ? ?  ? ?STOP taking these medications   ? ?tiZANidine 4 MG tablet ?Commonly known as: ZANAFLEX ?Stopped by: Tonny Bollman, MD ?  ? ?  ? ?TAKE these medications    ? ?Adderall XR 10 MG 24 hr capsule ?Generic drug: amphetamine-dextroamphetamine ?Take 10 mg by mouth daily. ?  ?albuterol 108 (90 Base) MCG/ACT inhaler ?Commonly known as: VENTOLIN HFA ?Inhale 2 puffs into the lungs every 4 (four) hours as needed for wheezing or shortness of breath. ?  ?azelastine 0.1 % nasal spray ?Commonly known as: ASTELIN ?Place 1 spray into both nostrils 2 (two) times daily as needed. ?  ?doxycycline 100 MG tablet ?Commonly known as: VIBRA-TABS ?Take 100 mg by mouth 2 (two) times daily. ?  ?escitalopram 20 MG tablet ?Commonly known as: LEXAPRO ?Take 1 tablet by mouth daily. ?  ?fluconazole 150 MG tablet ?Commonly known as: DIFLUCAN ?Take 150 mg by mouth once. Just prescribed yesterday ?  ?fluticasone 50 MCG/ACT nasal spray ?Commonly known as: Flonase ?Place 2 sprays into both nostrils daily as needed for allergies or rhinitis. ?  ?Jardiance 25 MG Tabs tablet ?Generic drug: empagliflozin ?Take 25 mg by mouth daily. ?  ?LORazepam 0.5 MG tablet ?Commonly known as: ATIVAN ?Take 0.5 mg by mouth as needed. ?  ?montelukast 10 MG tablet ?Commonly known as: Singulair ?Take 1 tablet (10 mg total) by mouth at bedtime. ?  ?omeprazole 40 MG capsule ?Commonly known as: PRILOSEC ?Take 40 mg by mouth daily. ?  ?pimecrolimus 1 % cream ?Commonly known as: Elidel ?Apply to face/neck, armpit or genital  areas can use a non-steroid ointment for eczema flare ups. ?  ?rosuvastatin 5 MG tablet ?Commonly known as: CRESTOR ?Take 1 tablet by mouth daily. ?  ?telmisartan 80 MG tablet ?Commonly known as: MICARDIS ?Take 80 mg by mouth daily. ?  ?triamcinolone cream 0.5 % ?Commonly known as: KENALOG ?Apply topically 2 (two) times daily as needed. ?What changed: See the new instructions. ?Changed by: Tonny BollmanErin Savas Elvin, MD ?  ? ?  ? ?Past Medical History:  ?Diagnosis Date  ? Anxiety   ? Asthma   ? Chlamydia   ? Depression with anxiety   ? GERD (gastroesophageal reflux disease)   ? 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 Hypertension   ? UTI (urinary tract infection)   ? ?Past Surgical History:  ?Procedure Laterality Date  ? ABDOMINAL HYSTERECTOMY Bilateral 11/2020  ? CESAREAN SECTION    ? x 2  ? DIAGNOSTIC LAPAROSCOPY WITH REMOVAL OF ECTOPIC PREGNANCY N/A 06/27/2016  ? Procedure: DIAGNOSTIC LAPAROSCOPY WITH REMOVAL OF ECTOPIC PREGNANCY;  Surgeon: Catalina AntiguaPeggy Constant, MD;  Location: [facility];  Service: Gynecology;  Laterality: N/A;  ? FOOT SURGERY    ? ?Otherwise, there have been no changes to her past medical history, surgical history, family history, or social history. ? ?ROS: All others negative except as noted per HPI.  ? ?Objective:  ?BP 138/88   Pulse 78   Temp 98.1 ?F (36.7 ?C) (Temporal)   SpO2 99%  ?There is no height or weight on file to calculate BMI. ?Physical Exam: ?General Appearance:  Alert, cooperative, no distress, appears stated age  ?Head:  Normocephalic, without obvious abnormality, atraumatic  ?Eyes:  Conjunctiva clear, EOM's intact  ?Nose: Nares normal, hypertrophic turbinates, normal mucosa, and no visible anterior polyps  ?Throat: Lips, tongue normal; teeth and gums normal, normal posterior oropharynx  ?Neck: Supple, symmetrical  ?Lungs:   clear to auscultation bilaterally, Respirations unlabored, no coughing  ?Heart:  regular rate and rhythm and no murmur, Appears well perfused  ?Extremities: No edema  ?Skin: Skin color, texture, turgor normal, no rashes or lesions on visualized portions of skin  ?Neurologic: No gross deficits  ? ?Assessment/Plan  ? ?Asthma: controlled ?- continue Singulair  ?-have access to albuterol inhaler 2 puffs every 4-6 hours as needed for cough/wheeze/shortness of breath/chest tightness.  May use 15-20 minutes prior to activity.   Monitor frequency of use.   ? ?Asthma control goals:  ?Full participation in all desired activities (may need albuterol before activity) ?Albuterol use two time or less a week on average (not counting use with activity) ?Cough interfering with sleep two time  or less a month ?Oral steroids no more than once a year ?No hospitalizations ? ?Allergic rhinitis: controlled ?-continue avoidance measures for grasses, weeds, trees, molds, cat and cockroach ?-continue Xyzal 5mg  daily as needed.  this is a long-acting antihistamine similar to Zyrtec that should be more effective  ?-continue Singulair 10mg  daily at bedtime.   ?-for itchy/watery eyes use Pataday 1 drop each eye daily as needed ?-use Flonase 2 sprays each nostril daily for 1-2 weeks at a time before stopping once nasal congestion improves for maximum benefit ?-use Astelin 2 sprays each nostril twice a day as needed for nasal drainage ?-consider allergen immunotherapy (allergy shots) if medication management is not effective enough in controlling allergy symptoms. ? ?Pollen Food Allergy Syndrome: stable ?-Skin test to peach was negative. ?-you have oral symptoms with food ingestion is related to oral allergy syndrome ?The oral allergy syndrome (OAS) or  pollen-food allergy syndrome (PFAS) is a relatively common form of food allergy, particularly in adults. It typically occurs in people who have pollen allergies when the immune system "sees" proteins on the food that look like proteins on the pollen. This results in the allergy antibody (IgE) binding to the food instead of the pollen. Patients typically report itching and/or mild swelling of the mouth and throat immediately following ingestion of certain uncooked fruits (including nuts) or raw vegetables. Only a very small number of affected individuals experience systemic allergic reactions, such as anaphylaxis which occurs with true food allergies.   ? ?Atopic dermatitis: controlled ?-keep skin moisturized after bathing ?-use Triamcinolone twice a day as needed for eczema flares on body.  This is a steroid ointment (do not use on face, armpits or genitalia area) ?-for eczema flares on face/neck, armpit or genital areas can use a non-steroid ointment Elidel twice a day  as needed ? ?Follow-up in 6 months or sooner if needed ?It was a pleasure meeting you today!  ? ?Tonny Bollman, MD  ?[facility] ? ? ? ? ? ? ?

## 2021-11-10 NOTE — Patient Instructions (Addendum)
Asthma: controlled ?- continue Singulair  ?-have access to albuterol inhaler 2 puffs every 4-6 hours as needed for cough/wheeze/shortness of breath/chest tightness.  May use 15-20 minutes prior to activity.   Monitor frequency of use.   ? ?Asthma control goals:  ?Full participation in all desired activities (may need albuterol before activity) ?Albuterol use two time or less a week on average (not counting use with activity) ?Cough interfering with sleep two time or less a month ?Oral steroids no more than once a year ?No hospitalizations ? ?Allergic rhinitis: controlled ?-continue avoidance measures for grasses, weeds, trees, molds, cat and cockroach ?-continue Xyzal 5mg  daily as needed.  this is a long-acting antihistamine similar to Zyrtec that should be more effective  ?-continue Singulair 10mg  daily at bedtime.   ?-for itchy/watery eyes use Pataday 1 drop each eye daily as needed ?-use Flonase 2 sprays each nostril daily for 1-2 weeks at a time before stopping once nasal congestion improves for maximum benefit ?-use Astelin 2 sprays each nostril twice a day as needed for nasal drainage ?-consider allergen immunotherapy (allergy shots) if medication management is not effective enough in controlling allergy symptoms. ? ?Pollen Food Allergy Syndrome: stable ?-Skin test to peach was negative. ?-you have oral symptoms with food ingestion is related to oral allergy syndrome ?The oral allergy syndrome (OAS) or pollen-food allergy syndrome (PFAS) is a relatively common form of food allergy, particularly in adults. It typically occurs in people who have pollen allergies when the immune system "sees" proteins on the food that look like proteins on the pollen. This results in the allergy antibody (IgE) binding to the food instead of the pollen. Patients typically report itching and/or mild swelling of the mouth and throat immediately following ingestion of certain uncooked fruits (including nuts) or raw vegetables. Only a  very small number of affected individuals experience systemic allergic reactions, such as anaphylaxis which occurs with true food allergies.   ? ?Atopic dermatitis: controlled ?-keep skin moisturized after bathing ?-use Triamcinolone twice a day as needed for eczema flares on body.  This is a steroid ointment (do not use on face, armpits or genitalia area) ?-for eczema flares on face/neck, armpit or genital areas can use a non-steroid ointment Elidel twice a day as needed ? ?Follow-up in 6 months or sooner if needed ?It was a pleasure meeting you today!  ? ? ? ?

## 2022-05-10 NOTE — Progress Notes (Signed)
FOLLOW UP Date of Service/Encounter:  05/12/22   Subjective:  Sheila Carroll (DOB: 03/26/1986) is a 36 y.o. female who [facility] in re-evaluation of the following: asthma, allergic rhinitis with conjunctivitis, pollen food allergy syndrome, eczema History obtained from: chart review and patient.  For Review, LV was on 11/10/21  with Dr.Gissell Barra seen for routine follow-up. She was doing well and we did not make any changes to her plan.  Today presents for follow-up. Asthma-doing well, only issues are with season changes. No need for rescue inhaler since last visit. No ED visits, antibiotics or OCS since last visit.  Allergic rhinitis: She did have to take benadryl a few days last week due to eye watering and nasal symptoms.  However otherwise has been very well controlled. Rash-she will occasionally break out into a rash on mostly her face and occasionally her extremities particularly during change of season.  It is now going away, but she still has some rough darker areas on legs and face.  He was unable to fill Elidel as this was not covered by her insurance. PFAS-dragon fruit, kiwi and peaches still cause her mouth to itch but she takes a benadryl and eats them without further symptoms  Pertinent History/Diagnostics:  Asthma: Intermittent, has some symptoms with season changes Allergic Rhinitis:                 - SPT environmental panel (2022): +grasses, weeds, trees, molds, cat and cockroach Food Allergy (peach, kiwi/dragon fruit)/Pollen Food Allergy Syndrome "mixed fruit drink her mouth itches.  She states drinks with kiwi will usually cause mouth to itch.  She doesn't eat peaches as use to make her mouth itch when she was a child."  Dragon fruit also give similar symptoms                - SPT select foods (2022): Negative to peach   Allergies as of 05/12/2022       Reactions   Liraglutide Other (See Comments), Diarrhea    Fatigue Fatigue Fatigue Fatigue   Metformin Diarrhea, Nausea And Vomiting   Ramipril Cough        Medication List        Accurate as of May 12, 2022  5:54 PM. If you have any questions, ask your nurse or doctor.          STOP taking these medications    doxycycline 100 MG tablet Commonly known as: VIBRA-TABS Stopped by: Verlee Monte, MD   fluconazole 150 MG tablet Commonly known as: DIFLUCAN Stopped by: Verlee Monte, MD   pimecrolimus 1 % cream Commonly known as: Elidel Stopped by: Verlee Monte, MD       TAKE these medications    Adderall XR 10 MG 24 hr capsule Generic drug: amphetamine-dextroamphetamine Take 10 mg by mouth daily.   albuterol 108 (90 Base) MCG/ACT inhaler Commonly known as: VENTOLIN HFA Inhale 2 puffs into the lungs every 4 (four) hours as needed for wheezing or shortness of breath.   azelastine 0.1 % nasal spray Commonly known as: ASTELIN Place 1 spray into both nostrils 2 (two) times daily as needed.   escitalopram 20 MG tablet Commonly known as: LEXAPRO Take 1 tablet by mouth daily.   Eucrisa 2 % Oint Generic drug: Crisaborole Apply 1 Application topically in the morning and at bedtime. Started by: Verlee Monte, MD   fluticasone 50 MCG/ACT nasal spray Commonly known as: Flonase  Place 2 sprays into both nostrils daily as needed for allergies or rhinitis.   Jardiance 25 MG Tabs tablet Generic drug: empagliflozin Take 25 mg by mouth daily.   LORazepam 0.5 MG tablet Commonly known as: ATIVAN Take 0.5 mg by mouth as needed.   montelukast 10 MG tablet Commonly known as: Singulair Take 1 tablet (10 mg total) by mouth at bedtime.   omeprazole 40 MG capsule Commonly known as: PRILOSEC Take 40 mg by mouth daily.   rosuvastatin 5 MG tablet Commonly known as: CRESTOR Take 1 tablet by mouth daily.   telmisartan 80 MG tablet Commonly known as: MICARDIS Take 80 mg by mouth daily.   triamcinolone cream 0.5 % Commonly  known as: KENALOG Apply topically 2 (two) times daily as needed.       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  ROS: All others negative except as noted per HPI.   Objective:  BP 134/82   Pulse 85   Temp 98.3 F (36.8 C) (Temporal)   Resp 16   SpO2 98%  There is no height or weight on file to calculate BMI. Physical Exam: General Appearance:  Alert, cooperative, no distress, appears stated age  Head:  Normocephalic, without obvious abnormality, atraumatic  Eyes:  Conjunctiva clear, EOM's intact  Nose: Nares normal, hypertrophic turbinates, normal mucosa, no visible anterior polyps, and septum midline  Throat: Lips, tongue normal; teeth and gums normal, normal posterior oropharynx  Neck: Supple, symmetrical  Lungs:   clear to auscultation bilaterally, Respirations unlabored, no coughing  Heart:  regular rate and rhythm and no murmur, Appears well perfused  Extremities: No edema  Skin: Skin color, texture, turgor normal, no rashes or lesions on visualized portions of skin  Neurologic: No gross deficits   Assessment/Plan   Asthma: controlled - continue Singulair  -have access to albuterol inhaler 2 puffs every 4-6 hours as needed for cough/wheeze/shortness of breath/chest  tightness.  May use 15-20 minutes prior to activity.   Monitor frequency of use.    Asthma control goals:  Full participation in all desired activities (may need albuterol before activity) Albuterol use two time or less a week on average (not counting use with activity) Cough interfering with sleep two time or less a month Oral steroids no more than once a year No hospitalizations  Allergic rhinitis: controlled -continue avoidance measures for grasses, weeds, trees, molds, cat and cockroach -continue Xyzal 5mg  daily as needed.  this is a long-acting antihistamine similar to Zyrtec that should be more effective  -continue Singulair 10mg  daily at bedtime.   -for itchy/watery eyes use Pataday 1 drop each eye daily as needed -use Flonase 2 sprays each nostril daily for 1-2 weeks at a time before stopping once  nasal congestion improves for maximum benefit -use Astelin 2 sprays each nostril twice a day as needed for nasal drainage -consider allergen immunotherapy (allergy shots) if medication management is not effective enough in controlling allergy symptoms.  Pollen Food Allergy Syndrome: stable -Skin test to peach was negative. 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 It typically occurs in people who have pollen allergies when the immune system "sees" proteins on the food that look like proteins on the pollen. This results in the allergy antibody (IgE) binding to the food instead of the pollen. Patients typically report itching and/or mild swelling of the mouth and throat immediately following ingestion of certain uncooked fruits (including nuts) or raw vegetables. Only a very small number of affected individuals experience systemic allergic reactions, such as anaphylaxis which occurs with true food allergies.    Atopic  dermatitis: controlled -keep skin moisturized after bathing -use Triamcinolone twice a day as needed for eczema flares on body.  This is a steroid ointment (do not use on face, armpits or genitalia area) -for eczema flares on face/neck, armpit or genital areas can use a non-steroid ointment Eucrisa 2% twice a day as needed  Follow-up in 6 months or sooner if needed It was a pleasure meeting you today!   Sigurd Sos, MD  [facility]

## 2022-05-12 ENCOUNTER — Encounter: Payer: Self-pay | Admitting: Internal Medicine

## 2022-05-12 ENCOUNTER — Ambulatory Visit: Payer: 59 | Admitting: Internal Medicine

## 2022-05-12 VITALS — BP 134/82 | HR 85 | Temp 98.3°F | Resp 16

## 2022-05-12 DIAGNOSIS — L2089 Other atopic dermatitis: Secondary | ICD-10-CM | POA: Diagnosis not present

## 2022-05-12 DIAGNOSIS — H1013 Acute atopic conjunctivitis, bilateral: Secondary | ICD-10-CM

## 2022-05-12 DIAGNOSIS — J3089 Other allergic rhinitis: Secondary | ICD-10-CM

## 2022-05-12 DIAGNOSIS — J4520 Mild intermittent asthma, uncomplicated: Secondary | ICD-10-CM | POA: Diagnosis not present

## 2022-05-12 DIAGNOSIS — T781XXD Other adverse food reactions, not elsewhere classified, subsequent encounter: Secondary | ICD-10-CM

## 2022-05-12 MED ORDER — MONTELUKAST SODIUM 10 MG PO TABS: 10.0000 mg | ORAL_TABLET | Freq: Every day | ORAL | 5 refills | Status: DC

## 2022-05-12 MED ORDER — TRIAMCINOLONE ACETONIDE 0.5 % EX CREA: TOPICAL_CREAM | Freq: Two times a day (BID) | CUTANEOUS | 3 refills | Status: DC | PRN

## 2022-05-12 MED ORDER — EUCRISA 2 % EX OINT: 1.0000 | TOPICAL_OINTMENT | Freq: Two times a day (BID) | CUTANEOUS | 3 refills | Status: DC

## 2022-05-12 NOTE — Patient Instructions (Addendum)
Asthma: controlled - continue Singulair  -have access to albuterol inhaler 2 puffs every 4-6 hours as needed for cough/wheeze/shortness of breath/chest tightness.  May use 15-20 minutes prior to activity.   Monitor frequency of use.    Asthma control goals:  Full participation in all desired activities (may need albuterol before activity) Albuterol use two time or less a week on average (not counting use with activity) Cough interfering with sleep two time or less a month Oral steroids no more than once a year No hospitalizations  Allergic rhinitis: controlled -continue avoidance measures for grasses, weeds, trees, molds, cat and cockroach -continue Xyzal 5mg  daily as needed.  this is a long-acting antihistamine similar to Zyrtec that should be more effective  -continue Singulair 10mg  daily at bedtime.   -for itchy/watery eyes use Pataday 1 drop each eye daily as needed -use Flonase 2 sprays each nostril daily for 1-2 weeks at a time before stopping once nasal congestion improves for maximum benefit -use Astelin 2 sprays each nostril twice a day as needed for nasal drainage -consider allergen immunotherapy (allergy shots) if medication management is not effective enough in controlling allergy symptoms.  Pollen Food Allergy Syndrome: stable -Skin test to peach was negative. 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 It typically occurs in people who have pollen allergies when the immune system "sees" proteins on the food that look like proteins on the pollen. This results in the allergy antibody (IgE) binding to the food instead of the pollen. Patients typically report itching and/or mild swelling of the mouth and throat immediately following ingestion of certain uncooked fruits (including nuts) or raw vegetables. Only a  very small number of affected individuals experience systemic allergic reactions, such as anaphylaxis which occurs with true food allergies.    Atopic dermatitis: controlled -keep skin moisturized after bathing -use Triamcinolone twice a day as needed for eczema flares on body.  This is a steroid ointment (do not use on face, armpits or genitalia area) -for eczema flares on face/neck, armpit or genital areas can use a non-steroid ointment Eucrisa 2% twice a day as needed  Follow-up in 6 months or sooner if needed It was a pleasure meeting you today!

## 2022-05-15 ENCOUNTER — Telehealth: Payer: Self-pay

## 2022-05-15 ENCOUNTER — Other Ambulatory Visit (HOSPITAL_COMMUNITY): Payer: Self-pay

## 2022-05-15 NOTE — Telephone Encounter (Signed)
Patient Advocate Encounter   Received notification from [employer] that prior authorization is required for Eucrisa 2% ointment  Submitted: 05-15-2022 Key [id number]  Status is pending

## 2022-05-16 NOTE — Telephone Encounter (Signed)
Patient Advocate Encounter  Prior Authorization for Eucrisa 2% ointment   has been approved.     Effective: 05-15-2022 to 05-16-2023

## 2022-08-15 ENCOUNTER — Ambulatory Visit (HOSPITAL_COMMUNITY)
Admission: EM | Admit: 2022-08-15 | Discharge: 2022-08-15 | Disposition: A | Discharge status: Home / Self Care | Payer: 59 | Attending: Psychiatry | Admitting: Psychiatry

## 2022-08-15 ENCOUNTER — Telehealth (HOSPITAL_COMMUNITY): Payer: Self-pay | Admitting: Licensed Clinical Social Worker

## 2022-08-15 DIAGNOSIS — F331 Major depressive disorder, recurrent, moderate: Secondary | ICD-10-CM | POA: Diagnosis not present

## 2022-08-15 MED ORDER — MIRTAZAPINE 7.5 MG PO TABS: 7.5000 mg | ORAL_TABLET | Freq: Every day | ORAL | 0 refills | Status: AC

## 2022-08-15 NOTE — Discharge Instructions (Signed)
A referral has been made on your behalf to the Partial Hospitalization Program (PHP).  They will reach out to you .

## 2022-08-15 NOTE — ED Provider Notes (Cosign Needed Addendum)
[facility] Medical Screening Exam  Patient Name: Sheila Carroll MRN: [id number] Date of Evaluation: 08/15/22 Chief Complaint:  "I just feel so overwhelmed". Diagnosis:  Final diagnoses:  MDD (major depressive disorder), recurrent episode, moderate (HCC)    History of Present illness: Sheila Carroll is a 37 y.o. female patient presented to [facility] as a walk in alone with complaints of, "I just feel so overwhelmed".  Sheila Carroll, 37 y.o., female patient seen face to face by this provider and chart reviewed on 08/15/22.  Per chart review patient has a past psychiatric history of adjustment disorder with mixed anxiety and depressed mood.  She reports being diagnosed with depression, anxiety, ADHD, PTSD and panic attacks.  She receives medication management through Dr. Landry Mellow her PCP; Lexapro 20 mg daily Ativan 0.5 mg as needed.  She receives Adderall through [place] [facility].  She is currently not satisfied with her providers, she would prefer to have a psychiatric provider who can manage all of her medications.  She denies any substance use.   On evaluation Sheila Carroll reports she works 3 different jobs.  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  She has 2 biological children and 1 adopted child that all have specific needs.  One of her children required total care.  She is married and considers her spouse supportive.  Reports she used to take care of herself and attend counseling but once things became overwhelming between her job and her family she put therapy and herself on the back burner.  Over the past few months she has noticed an increase in her depression and anxiety.  She states, "I am trying to get ahead of it".  She reports a history of having depression that was so severe she could not perform her ADLs.  However she does deny any psychiatric admissions in the past.  She denies any suicide attempts.  She does not believe that her Lexapro is effective in managing her  depression as she has been on it for 5 years.  She presents today requesting medication management for increased depression and anxiety.  During evaluation Sheila Carroll is observed sitting in the assessment room in no acute distress.  She is fairly groomed and makes good eye contact.  She is alert/oriented x 4, cooperative, and attentive.  She has clear speech at a normal rate and tone.  She is tearful throughout the assessment when discussing all of her stressors.  She endorses increase in her depression with feelings of helplessness, hopelessness, decreased motivation, decreased focus, increased tearfulness and irritability. Reports she only sleeps 2-3 hours per night and has a decreased appetite.  She has a depressed affect.  She was unable to go to work today due to her symptoms.  She denies SI/HI/AVH.   She adamantly contracts for safety.  She does not appear to be responding to internal/external stimuli.  She is able to answer questions appropriately  Discussed [facility] program and referral was placed.  Discussed adding Remeron 7.5 mg nightly at night for depression and sleep as an adjunct to her current regimen.  Educated on medication including side effects.  Patient verbalized understanding and is in agreement.  She was provided a 14-day prescription that was sent to her pharmacy of choice.    Psychiatric Specialty Exam  Presentation  General Appearance:Appropriate for Environment; Casual  Eye Contact:Good  Speech:Clear and Coherent; Normal Rate  Speech Volume:Normal  Handedness:Right   Mood and Affect  Mood: Anxious; Depressed  Affect: Tearful; Congruent  Thought Process  Thought Processes: Coherent  Descriptions of Associations:Intact  Orientation:Full (Time, Place and Person)  Thought Content:Logical    Hallucinations:None  Ideas of Reference:None  Suicidal Thoughts:No  Homicidal Thoughts:No   Sensorium  Memory: Immediate Good; Recent Good; Remote  Good  Judgment: Good  Insight: Good   Executive Functions  Concentration: Good  Attention Span: Good  Recall: Good  Fund of Knowledge: Good  Language: Good   Psychomotor Activity  Psychomotor Activity: Normal   Assets  Assets: Communication Skills; Desire for Improvement; Financial Resources/Insurance; Physical Health; Resilience; Social Support   Sleep  Sleep: Poor  Number of hours:  3   Physical Exam: Physical Exam Vitals and nursing note reviewed.  Constitutional:      General: She is not in acute distress.    Appearance: Normal appearance. She is well-developed.  HENT:     Head: Normocephalic and atraumatic.  Eyes:     General:        Right eye: No discharge.        Left eye: No discharge.  Cardiovascular:     Rate and Rhythm: Normal rate.  Pulmonary:     Effort: Pulmonary effort is normal. No respiratory distress.  Musculoskeletal:        General: Normal range of motion.     Cervical back: Normal range of motion.  Skin:    General: Skin is warm.     Coloration: Skin is not jaundiced or pale.  Neurological:     Mental Status: She is alert and oriented to person, place, and time.  Psychiatric:        Attention and Perception: Attention and perception normal.        Mood and Affect: Mood is anxious and depressed. Affect is tearful.        Speech: Speech normal.        Behavior: Behavior normal. Behavior is cooperative.        Thought Content: Thought content normal.        Cognition and Memory: Cognition normal.        Judgment: Judgment normal.    Review of Systems  Constitutional: Negative.   HENT: Negative.    Eyes: Negative.   Respiratory: Negative.    Cardiovascular: Negative.   Musculoskeletal: Negative.   Skin: Negative.   Neurological: Negative.   Psychiatric/Behavioral:  Positive for depression. The patient is nervous/anxious and has insomnia.    Blood pressure (!) 140/106, pulse 83, temperature 97.7 F (36.5 C),  temperature source Oral, resp. rate 18, SpO2 100 %. There is no height or weight on file to calculate BMI.  Musculoskeletal: Strength & Muscle Tone: within normal limits Gait & Station: normal Patient leans: N/A   [place] [facility] Discharge Disposition for Follow up and Recommendations: Based on my evaluation the patient does not appear to have an emergency medical condition and can be discharged with resources and follow up care in outpatient services for Medication Management, Partial Hospitalization Program, and Individual Therapy  Discharge patient  Referral made to [facility] program.  Provided outpatient psychiatric resources for medication management and therapy.  Sent 14-day prescription for Remeron 7.5 mg nightly to patient's pharmacy of choice.     Revonda Humphrey, NP 08/15/2022, 12:30 PM

## 2022-08-15 NOTE — ED Notes (Signed)
Discharge instructions provided and Pt stated understanding. Pt alert, orient and ambulatory prior to d/c from facility. Personal belongings returned. Safety maintained.

## 2022-08-15 NOTE — Social Work (Signed)
Patient is a walk in, patient reports that she has been feeling very down. Also reports feeling very depressed. The patient has three children that all have special needs and has been very overwhelmed.   The patient states that she feels like her medication is not working. The patient denies HI, SI. The patient also stated that she smokes weed and drinks on occasion. The patient is well-groomed, the patient wants help with her medication management patient states she feels like its not working for her depression

## 2022-08-16 ENCOUNTER — Ambulatory Visit (HOSPITAL_COMMUNITY): Payer: 59 | Admitting: Professional

## 2022-11-17 ENCOUNTER — Ambulatory Visit: Payer: 59 | Admitting: Internal Medicine

## 2022-12-17 NOTE — Progress Notes (Unsigned)
FOLLOW UP Date of Service/Encounter:  12/18/22   Subjective:  Sheila Carroll (DOB: June 28, 1986) is a 37 y.o. female who [facility] in re-evaluation of the following: asthma, allergic rhinitis with conjunctivitis, pollen food allergy syndrome, eczema  History obtained from: chart review and patient.  For Review, LV was on 05/12/22  with Dr.Arthurine Oleary seen for /routine follow-up. See below for summary of history and diagnostics.  Therapeutic plans/changes recommended: She was doing well.  We continued Singulair daily, Albuterol as needed, Xyzal daily as needed, Flonase 2 SEN daily as needed, azelastine nasal spray 2 SEN daily as needed, and Eucrisa 2% and triamcinolone.  Pertinent History/Diagnostics:  Asthma: Intermittent, has some symptoms with season changes Allergic Rhinitis:  - SPT environmental panel (2022): +grasses, weeds, trees, molds, cat and cockroach Food Allergy (peach, kiwi/dragon fruit/strawberry/onions/peppers)/Pollen Food Allergy Syndrome "mixed fruit drink her mouth itches.  She states drinks with kiwi will usually cause mouth to itch.  She doesn't eat peaches as use to make her mouth itch when she was a child."  Dragon fruit also give similar symptoms - SPT select foods (2022): Negative to peach   Today presents for follow-up. Eczema: skin is acting up Asthma: forehead and hair line is flared up and rashy. She is using her topical steroids without improvement. She is getting ready to take antibiotics for a UTI and she is planning on getting a tooth extraction and will need antibiotics. She has received prednisone for hip pain.  She continues to deny any respiratory illnesses or respiratory issues.  She is not using her rescue inhaler. She does take her Singulair. She has noticed increased allergic rhinitis symptoms during the spring.  She has also noticed occasional mouth itching when eating certain [place] foods with peppers and  onions as well as occasionally when she eats strawberries.  Denies any other systemic symptoms.   Allergies as of 12/18/2022       Reactions   Liraglutide Other (See Comments), Diarrhea   Fatigue Fatigue Fatigue Fatigue   Metformin Diarrhea, Nausea And Vomiting   Ramipril Cough        Medication List        Accurate as of December 18, 2022 10:49 AM. If you have any questions, ask your nurse or doctor.          Adderall XR 10 MG 24 hr capsule Generic drug: amphetamine-dextroamphetamine Take 10 mg by mouth daily.   albuterol 108 (90 Base) MCG/ACT inhaler Commonly known as: VENTOLIN HFA Inhale 2 puffs into the lungs every 4 (four) hours as needed for wheezing or shortness of breath.   azelastine 0.1 % nasal spray Commonly known as: ASTELIN Place 1 spray into both nostrils 2 (two) times daily as needed.   budesonide-formoterol 80-4.5 MCG/ACT inhaler Commonly known as: Symbicort Inhale 2 puffs into the lungs 2 (two) times daily. Started by: Verlee Monte, MD   escitalopram 20 MG tablet Commonly known as: LEXAPRO Take 1 tablet by mouth daily.   Eucrisa 2 % Oint Generic drug: Crisaborole Apply 1 Application topically in the morning and at bedtime.   fluticasone 50 MCG/ACT nasal spray Commonly known as: Flonase Place 2 sprays into both nostrils daily as needed for allergies or rhinitis.   Jardiance 25 MG Tabs tablet Generic drug: empagliflozin Take 25 mg by mouth daily.   ketoconazole 2 % shampoo Commonly known as: NIZORAL Apply 1 Application topically 3 (three) times a week. apply shampoo TOPICALLY to wet  hair, lather, rinse thoroughly, and repeat; use every 3 to 4 days for up to 8 weeks; then as needed to control dandruff Started by: Verlee Monte, MD   LORazepam 0.5 MG tablet Commonly known as: ATIVAN Take 0.5 mg by mouth as needed.   mirtazapine 7.5 MG tablet Commonly known as: REMERON Take 1 tablet (7.5 mg total) by mouth at bedtime.   montelukast 10 MG  tablet Commonly known as: Singulair Take 1 tablet (10 mg total) by mouth at bedtime.   omeprazole 40 MG capsule Commonly known as: PRILOSEC Take 40 mg by mouth daily.   rosuvastatin 5 MG tablet Commonly known as: CRESTOR Take 1 tablet by mouth daily.   telmisartan 80 MG tablet Commonly known as: MICARDIS Take 80 mg by mouth daily.   triamcinolone cream 0.5 % Commonly known as: KENALOG Apply topically 2 (two) times daily as needed.       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  ROS: All others negative except as noted per HPI.   Objective:  BP 132/76   Pulse 70   Temp 97.7 F (36.5 C) (Temporal)   Resp 18   Wt 209 lb 3.2 oz (94.9 kg)   SpO2 100%   BMI 36.48 kg/m  Body mass index is 36.48 kg/m. Physical Exam: General Appearance:  Alert, cooperative, no distress, appears stated age  Head:  Normocephalic, without obvious abnormality, atraumatic  Eyes:  Conjunctiva clear, EOM's intact  Nose: Nares normal, hypertrophic turbinates and normal mucosa  Throat: Lips, tongue normal; teeth and gums normal, normal posterior oropharynx  Neck: Supple, symmetrical  Lungs:   clear to auscultation bilaterally, Respirations unlabored, no coughing  Heart:  regular rate and rhythm and  no murmur, Appears well perfused  Extremities: No edema  Skin: Scaly plaques on entire scalp, forehead, glabella, eyebrows and ears  Neurologic: No gross deficits  Spirometry:  Tracings reviewed. Her effort: Good reproducible efforts. 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 Please see scanned spirometry results for details.  Assessment/Plan  Her spirometry shows severe obstruction, though clinically she denies any symptoms.  She is unsure if she is an under perceiver of her symptoms. She is not using her inhalers, but we discussed the importance of preserving her lung function.  We will start an ICS/LABA combination and I have encouraged her to use it twice daily. The rash that is on her hairline and scalp per location is concerning for seborrheic deramtitis.  However we did discuss possibility of plaque psoriasis, and if she is not getting improvement with ketoconazole shampoo we will consider referral to dermatology. Otherwise doing well. Reporting increase in pollen food allergy syndrome symptoms  during springtime.  Asthma: - continue Singulair  - add Symbicort 80 mcg 2 puffs twice daily. Use every day. Rinse mouth out after use. -have access to albuterol inhaler 2 puffs every 4-6 hours as needed for cough/wheeze/shortness of breath/chest tightness.  May use 15-20 minutes prior to activity.   Monitor frequency of use.    Asthma control goals:  Full participation in all desired activities (may need albuterol before activity) Albuterol use two time or less a week on average (not counting use with activity) Cough interfering with sleep two time or less a month Oral steroids no more than once a year No hospitalizations  Allergic rhinitis:  -continue avoidance measures for grasses, weeds, trees, molds, cat and cockroach -continue Xyzal 5mg  daily as needed.  this is a  long-acting antihistamine similar to Zyrtec that should be more effective  -continue Singulair 10mg  daily at bedtime.   -for itchy/watery eyes use Pataday 1 drop each eye daily as needed -use Flonase 2 sprays each nostril daily for 1-2 weeks at a time before stopping once nasal congestion improves for maximum benefit -use Astelin 2 sprays each nostril twice a day as needed for nasal drainage -consider allergen immunotherapy (allergy shots) if medication management is not effective enough in controlling allergy symptoms.  Pollen Food Allergy Syndrome: -Skin test to peach was negative. 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 It typically occurs in people who have pollen allergies when the immune system "sees" proteins on the food that look like proteins on the pollen. This results in the allergy antibody (IgE) binding to the food instead of the pollen. Patients typically report itching and/or mild swelling of the mouth and throat immediately following ingestion of certain uncooked fruits (including nuts) or raw vegetables. Only a very small number of affected individuals experience systemic allergic reactions, such as anaphylaxis which occurs with true food allergies.    Atopic dermatitis:  -keep skin moisturized after bathing -use Triamcinolone twice a day as needed for eczema flares on body.  This is a steroid ointment (do not use on face, armpits or genitalia area) -for eczema flares on face/neck, armpit or genital areas can use a non-steroid ointment Eucrisa 2% twice a day as needed  Seborrheic Dermatitis:  - use ketoconazole shampoo 2% three times per week on scalp and face and ears for 6-8 weeks, can extend to 12 weeks if needed and then use as needed - use a soft bristle tooth brush to loosen any plaques - let me  know if no improvement, will consider Dermatology referral  Follow-up in 6 months or sooner if needed  Tonny Bollman, MD  [facility]

## 2022-12-18 ENCOUNTER — Ambulatory Visit (INDEPENDENT_AMBULATORY_CARE_PROVIDER_SITE_OTHER): Payer: PRIVATE HEALTH INSURANCE | Admitting: Internal Medicine

## 2022-12-18 ENCOUNTER — Encounter: Payer: Self-pay | Admitting: Internal Medicine

## 2022-12-18 VITALS — BP 132/76 | HR 70 | Temp 97.7°F | Resp 18 | Wt 209.2 lb

## 2022-12-18 DIAGNOSIS — L2089 Other atopic dermatitis: Secondary | ICD-10-CM | POA: Diagnosis not present

## 2022-12-18 DIAGNOSIS — J3089 Other allergic rhinitis: Secondary | ICD-10-CM | POA: Diagnosis not present

## 2022-12-18 DIAGNOSIS — J4520 Mild intermittent asthma, uncomplicated: Secondary | ICD-10-CM | POA: Diagnosis not present

## 2022-12-18 DIAGNOSIS — K9049 Malabsorption due to intolerance, not elsewhere classified: Secondary | ICD-10-CM

## 2022-12-18 DIAGNOSIS — T781XXD Other adverse food reactions, not elsewhere classified, subsequent encounter: Secondary | ICD-10-CM

## 2022-12-18 DIAGNOSIS — H1013 Acute atopic conjunctivitis, bilateral: Secondary | ICD-10-CM | POA: Diagnosis not present

## 2022-12-18 DIAGNOSIS — L219 Seborrheic dermatitis, unspecified: Secondary | ICD-10-CM

## 2022-12-18 MED ORDER — KETOCONAZOLE 2 % EX SHAM: 1.0000 | MEDICATED_SHAMPOO | CUTANEOUS | 3 refills | Status: DC

## 2022-12-18 MED ORDER — BUDESONIDE-FORMOTEROL FUMARATE 80-4.5 MCG/ACT IN AERO: 2.0000 | INHALATION_SPRAY | Freq: Two times a day (BID) | RESPIRATORY_TRACT | 5 refills | Status: DC

## 2022-12-18 NOTE — Patient Instructions (Addendum)
Asthma: - continue Singulair  - add Symbicort 80 mcg 2 puffs twice daily. Use every day. Rinse mouth out after use. -have access to albuterol inhaler 2 puffs every 4-6 hours as needed for cough/wheeze/shortness of breath/chest tightness.  May use 15-20 minutes prior to activity.   Monitor frequency of use.    Asthma control goals:  Full participation in all desired activities (may need albuterol before activity) Albuterol use two time or less a week on average (not counting use with activity) Cough interfering with sleep two time or less a month Oral steroids no more than once a year No hospitalizations  Allergic rhinitis:  -continue avoidance measures for grasses, weeds, trees, molds, cat and cockroach -continue Xyzal 5mg  daily as needed.  this is a long-acting antihistamine similar to Zyrtec that should be more effective  -continue Singulair 10mg  daily at bedtime.   -for itchy/watery eyes use Pataday 1 drop each eye daily as needed -use Flonase 2 sprays each nostril daily for 1-2 weeks at a time before stopping once nasal congestion improves for maximum benefit -use Astelin 2 sprays each nostril twice a day as needed for nasal drainage -consider allergen immunotherapy (allergy shots) if medication management is not effective enough in controlling allergy symptoms.  Pollen Food Allergy Syndrome: -Skin test to peach was negative. 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 It typically occurs in people who have pollen allergies when the immune system "sees" proteins on the food that look like proteins on the pollen. This results in the allergy antibody (IgE) binding to the food instead of the pollen. Patients typically report itching and/or mild swelling of the mouth and throat immediately following ingestion of certain  uncooked fruits (including nuts) or raw vegetables. Only a very small number of affected individuals experience systemic allergic reactions, such as anaphylaxis which occurs with true food allergies.    Atopic dermatitis:  -keep skin moisturized after bathing -use Triamcinolone twice a day as needed for eczema flares on body.  This is a steroid ointment (do not use on face, armpits or genitalia area) -for eczema flares on face/neck, armpit or genital areas can use a non-steroid ointment Eucrisa 2% twice a day as needed  Seborrheic Dermatitis:  - use ketoconazole shampoo 2% three times per week on scalp and face and ears for 6-8 weeks, can extend to 12 weeks if needed and then use as needed - use a soft bristle tooth brush to loosen any plaques - let me know if no improvement, will consider Dermatology referral  Follow-up in 6 months or sooner if needed It was a pleasure seeing you again today!

## 2023-01-25 NOTE — Psych (Signed)
Pt declined PHP.

## 2023-06-20 ENCOUNTER — Ambulatory Visit: Payer: PRIVATE HEALTH INSURANCE | Admitting: Internal Medicine

## 2023-07-30 ENCOUNTER — Other Ambulatory Visit: Payer: Self-pay | Admitting: Internal Medicine

## 2024-03-20 ENCOUNTER — Ambulatory Visit: Admitting: Internal Medicine

## 2024-03-20 ENCOUNTER — Encounter: Payer: Self-pay | Admitting: Internal Medicine

## 2024-03-20 ENCOUNTER — Other Ambulatory Visit: Payer: Self-pay

## 2024-03-20 VITALS — BP 126/76 | HR 82 | Temp 97.7°F | Resp 20 | Ht 63.0 in | Wt 211.0 lb

## 2024-03-20 DIAGNOSIS — H1013 Acute atopic conjunctivitis, bilateral: Secondary | ICD-10-CM | POA: Diagnosis not present

## 2024-03-20 DIAGNOSIS — J4520 Mild intermittent asthma, uncomplicated: Secondary | ICD-10-CM | POA: Diagnosis not present

## 2024-03-20 DIAGNOSIS — L219 Seborrheic dermatitis, unspecified: Secondary | ICD-10-CM

## 2024-03-20 DIAGNOSIS — L2089 Other atopic dermatitis: Secondary | ICD-10-CM | POA: Diagnosis not present

## 2024-03-20 DIAGNOSIS — T781XXD Other adverse food reactions, not elsewhere classified, subsequent encounter: Secondary | ICD-10-CM

## 2024-03-20 MED ORDER — ALBUTEROL SULFATE HFA 108 (90 BASE) MCG/ACT IN AERS: 2.0000 | INHALATION_SPRAY | RESPIRATORY_TRACT | 1 refills | Status: AC | PRN

## 2024-03-20 MED ORDER — CLOTRIMAZOLE 1 % EX CREA: 1.0000 | TOPICAL_CREAM | Freq: Two times a day (BID) | CUTANEOUS | 2 refills | Status: AC

## 2024-03-20 MED ORDER — KETOCONAZOLE 2 % EX SHAM: MEDICATED_SHAMPOO | CUTANEOUS | 11 refills | Status: AC

## 2024-03-20 MED ORDER — LEVALBUTEROL TARTRATE 45 MCG/ACT IN AERO
4.0000 | INHALATION_SPRAY | Freq: Once | RESPIRATORY_TRACT | Status: AC
  Administered 2024-03-20: 4 via RESPIRATORY_TRACT

## 2024-03-20 MED ORDER — TRIAMCINOLONE ACETONIDE 0.5 % EX CREA: TOPICAL_CREAM | Freq: Two times a day (BID) | CUTANEOUS | 3 refills | Status: AC | PRN

## 2024-03-20 MED ORDER — EUCRISA 2 % EX OINT: 1.0000 | TOPICAL_OINTMENT | Freq: Two times a day (BID) | CUTANEOUS | 3 refills | Status: AC

## 2024-03-20 MED ORDER — MONTELUKAST SODIUM 10 MG PO TABS: 10.0000 mg | ORAL_TABLET | Freq: Every day | ORAL | 5 refills | Status: AC

## 2024-03-20 MED ORDER — BUDESONIDE-FORMOTEROL FUMARATE 80-4.5 MCG/ACT IN AERO: 2.0000 | INHALATION_SPRAY | Freq: Two times a day (BID) | RESPIRATORY_TRACT | 5 refills | Status: AC

## 2024-03-20 NOTE — Patient Instructions (Signed)
 Asthma: not well controlled Spirometry today shows moderate obstruction with improvement following bronchodilator. - continue Singulair   - restart Symbicort  80 mcg 2 puffs twice daily. Use every day. Rinse mouth out after use. -have access to albuterol  inhaler 2 puffs every 4-6 hours as needed for cough/wheeze/shortness of breath/chest tightness.  May use 15-20 minutes prior to activity.   Monitor frequency of use.   Asthma control goals:  Full participation in all desired activities (may need albuterol  before activity) Albuterol  use two time or less a week on average (not counting use with activity) Cough interfering with sleep two time or less a month Oral steroids no more than once a year No hospitalizations  Allergic rhinitis: controlled -continue avoidance measures for grasses, weeds, trees, molds, cat and cockroach -restart Xyzal 5mg  daily as needed.   -restart Singulair  10mg  daily at bedtime.   -for itchy/watery eyes use Pataday 1 drop each eye daily as needed -use Flonase  2 sprays each nostril daily for 1-2 weeks at a time before stopping once nasal congestion improves for maximum benefit -use Astelin  2 sprays each nostril twice a day as needed for nasal drainage -consider allergen immunotherapy (allergy shots) if medication management is not effective enough in controlling allergy symptoms.  Pollen Food Allergy Syndrome: controlled -Skin test to peach was negative. 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 It typically occurs in people who have pollen allergies when the immune system sees proteins on the food that look like proteins on the pollen. This results in the allergy antibody (IgE) binding to the food instead of the pollen. Patients typically report itching and/or mild swelling of the mouth and  throat immediately following ingestion of certain uncooked fruits (including nuts) or raw vegetables. Only a very small number of affected individuals experience systemic allergic reactions, such as anaphylaxis which occurs with true food allergies.    Atopic dermatitis: not controlled -keep skin moisturized after bathing -use Triamcinolone  twice a day as needed for eczema flares on body.  This is a steroid ointment (do not use on face, armpits or genitalia area) -for eczema flares on face/neck, armpit or genital areas can use a non-steroid ointment Eucrisa  2% twice a day as needed -return for patch testing-this will occur Monday in our [place] office, and then Wednesday and Friday in our [place] office (must be off topical steroids for 2 weeks and oral steroids or injectable steroids for 4 weeks)  Seborrheic Dermatitis: not well controlled - use ketoconazole  shampoo 2% three times per week on scalp and face and ears for 6-8 weeks, can extend to 12 weeks if needed and then use as needed - use a soft bristle tooth brush to loosen any plaques - referral to Dermatology - trial of clotrimazole  twice daily as needed on ares of skin around nose and eyebrows x 14 days.  Follow-up in 6 months or sooner if needed Schedule patch testing on your way out. It was a pleasure seeing you again today!

## 2024-03-20 NOTE — Progress Notes (Signed)
 FOLLOW UP Date of Service/Encounter:   03/20/2024  Subjective:  Sheila Carroll (DOB: [birth year]) is a 38 y.o. female who [facility] in re-evaluation of the following: asthma, allergic rhinitis with conjunctivitis, pollen food allergy syndrome, eczema  History obtained from: chart review and patient.  For Review, LV was on 12/18/22  with Dr.Trini Soldo seen for routine follow-up. See below for summary of history and diagnostics.   Therapeutic plans/changes recommended: reports asthma controlled, not using rescue inhaler, however FEV1 48%. Suspected underperceiver of her symptoms.  She was also noted to have seborrheic dermatitis though we discussed possibility of plaque psoriasis.  We gave her ketoconazole  shampoo and told her to let us  know if no improvement as we would consider referral to dermatology. ----------------------------------------------------- Pertinent History/Diagnostics:  Asthma Underperceiver of asthma Allergic Rhinitis:  - SPT environmental panel (2022): +grasses, weeds, trees, molds, cat and cockroach Food Allergy (peach, kiwi/dragon fruit/strawberry/onions/peppers)/Pollen Food Allergy Syndrome mixed fruit drink her mouth itches.  She states drinks with kiwi will usually cause mouth to itch.  She doesn't eat peaches as use to make her mouth itch when she was a child.  Dragon fruit also give similar symptoms - SPT select foods (2022): Negative to peach  --------------------------------------------------- Today presents for follow-up. Discussed the use of AI scribe software for clinical note transcription with the patient, who gave verbal consent to proceed.  History of Present Illness Sheila Carroll is a 38 year old female with asthma who presents with breathing difficulties and skin concerns.  Asthmatic symptoms and medication adherence - Asthma with history of attacks in early twenties, triggered by cold weather and  stress, resulting in hyperventilation and syncope - No recent use of rescue inhaler, oral steroids, or emergency room visits - under perceiver of symptoms - Inconsistent use of Singulair  and Symbicort  - ADHD may contribute to inconsistent medication adherence  Scalp dermatitis - Chronic scalp symptoms for over one year, including peeling, rawness, and rough texture - Symptoms worsen with seasonal changes - Inconsistent use of prescribed shampoo, which is effective when used - Additional use of Seabreeze and shea butter for moisturizing her face - Persistent discoloration and roughness of scalp - History of childhood scalp issues  Allergic symptoms - Occasional allergy symptoms that resolve without medication - No regular use of nasal sprays or oral antihistamines  Gastroesophageal reflux symptoms - Intermittent use of omeprazole  as needed - Reflux symptoms are controlled without daily medication   All medications reviewed by clinical staff and updated in chart. No new pertinent medical or surgical history except as noted in HPI.  ROS: All others negative except as noted per HPI.   Objective:  BP 126/76   Pulse 82   Temp 97.7 F (36.5 C) (Temporal)   Resp 20   Ht 5' 3 (1.6 m)   Wt 211 lb (95.7 kg)   SpO2 99%   BMI 37.38 kg/m  Body mass index is 37.38 kg/m. Physical Exam: General Appearance:  Alert, cooperative, no distress, appears stated age  Head:  Normocephalic, without obvious abnormality, atraumatic  Eyes:  Conjunctiva clear, EOM's intact  Ears EACs normal bilaterally and normal TMs bilaterally  Nose: Nares normal, hypertrophic turbinates, normal mucosa, and no visible anterior polyps  Throat: Lips, tongue normal; teeth and gums normal, normal posterior oropharynx  Neck: Supple, symmetrical  Lungs:   clear to auscultation bilaterally, Respirations unlabored, no coughing  Heart:  regular rate and rhythm and no murmur, Appears well perfused  Extremities: No edema   Skin: Thick flaky and scaly scalp with both hyper and hpopigementation scattered on her forehead, nasolabial folds and chin  Neurologic: No gross deficits      Labs:  Lab Orders  No laboratory test(s) ordered today    Spirometry:  Tracings reviewed. Her effort: Good reproducible efforts. FVC: 2.12L pre, 2.59L post, + 19% and 410 mL change FEV1: 1.52L, 59% predicted pre, 1.86L, 72% predicted post, + 22 % improvement FEV1/FVC ratio: 0.72 pre, 0.74 post Interpretation: Spirometry consistent with moderate obstructive disease. With significant improvement following post bronchodilator (4 puffs Xopenex ) Please see scanned spirometry results for details.   Assessment/Plan   Asthma: not well controlled Spirometry today shows moderate obstruction with improvement following bronchodilator. - continue Singulair   - restart Symbicort  80 mcg 2 puffs twice daily. Use every day. Rinse mouth out after use. -have access to albuterol  inhaler 2 puffs every 4-6 hours as needed for cough/wheeze/shortness of breath/chest tightness.  May use 15-20 minutes prior to activity.   Monitor frequency of use.   Asthma control goals:  Full participation in all desired activities (may need albuterol  before activity) Albuterol  use two time or less a week on average (not counting use with activity) Cough interfering with sleep two time or less a month Oral steroids no more than once a year No hospitalizations  Allergic rhinitis: controlled -continue avoidance measures for grasses, weeds, trees, molds, cat and cockroach -restart Xyzal 5mg  daily as needed.   -restart Singulair  10mg  daily at bedtime.   -for itchy/watery eyes use Pataday 1 drop each eye daily as needed -use Flonase  2 sprays each nostril daily for 1-2 weeks at a time before stopping once nasal congestion improves for maximum benefit -use Astelin  2 sprays each nostril twice a day as needed for nasal drainage -consider allergen immunotherapy  (allergy shots) if medication management is not effective enough in controlling allergy symptoms.  Pollen Food Allergy Syndrome: controlled -Skin test to peach was negative. 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 It typically occurs in people who have pollen allergies when the immune system sees proteins on the food that look like proteins on the pollen. This results in the allergy antibody (IgE) binding to the food instead of the pollen. Patients typically report itching and/or mild swelling of the mouth and throat immediately following ingestion of certain uncooked fruits (including nuts) or raw vegetables. Only a very small number of affected individuals experience systemic allergic reactions, such as anaphylaxis which occurs with true food allergies.    Atopic dermatitis: not controlled -keep skin moisturized after bathing -use Triamcinolone  twice a day as needed for eczema flares on body.  This is a steroid ointment (do not use on face, armpits or genitalia area) -for eczema flares on face/neck, armpit or genital areas can use a non-steroid ointment Eucrisa  2% twice a day as needed -return for patch testing-this will occur Monday in our [place] office, and then Wednesday and Friday in our [place] office (must be off topical steroids for 2 weeks and oral steroids or injectable steroids for 4 weeks)  Seborrheic Dermatitis vs Plaque Psoriasis: not well controlled - use ketoconazole  shampoo 2% three times per week on scalp and face and ears for 6-8 weeks, can extend to 12 weeks if needed and then use as needed - use a soft bristle tooth  brush to loosen any plaques - referral to Dermatology - trial of clotrimazole  twice daily as needed on ares of skin around nose and eyebrows x 14 days.  Follow-up in 6 months or sooner if  needed Schedule patch testing on your way out. It was a pleasure seeing you again today!   Other: samples proi  Rocky Endow, MD  [facility] 

## 2024-04-01 ENCOUNTER — Telehealth: Payer: Self-pay | Admitting: Internal Medicine

## 2024-04-01 NOTE — Telephone Encounter (Signed)
 Dermatology closed out Sheila Carroll's referral due to Patient Refusal

## 2024-05-19 ENCOUNTER — Encounter: Admitting: Family Medicine

## 2024-05-21 ENCOUNTER — Encounter: Admitting: Internal Medicine

## 2024-05-26 ENCOUNTER — Encounter: Admitting: Family

## 2024-06-01 NOTE — Progress Notes (Unsigned)
   [address] [place] [address] Dept: [phone] [address] [place] [address] Dept: [phone]  Follow-up Note  RE: Sheila Carroll MRN: [id number] DOB: 1985-09-21 Date of Office Visit: 06/02/2024  Primary care provider: Podraza, Cole Christopher, PA-C Referring provider: Podraza, Cole Christoph*   Sheila Carroll returns to the office today for the patch test placement, given suspected history of contact dermatitis.  At today's visit, she reports that she is feeling well overall.  She has not had any steroids over the last 4 weeks.  Care of patches discussed, specifically the need to keep patches dry and in place.  All questions answered at today's visit.   Diagnostics: NAC 80 patches placed NAC-80 (1-80)   1. Ammonium persulfate  2. [place] Balsam  3. Omitted  4. 4-tert-Butylphenolformaldehyde resin (PTBP)  5. Bacitracin  6. Budesonide   7. Quaternium-15  8. Cinnamal  9. Cobalt(II) chloride hexahydrate  10. Colophonium  11. Methyldibromo glutaronitrile  12. Decyl Glucoside  13. Ethylenediamine dihydrochloride  14. 2-Hydroxyethyl methacrylate  15. Hydroperoxides of Linalool  16. Iodopropynyl butylcarbamate  17. 2-Mercaptobenzothiazole (MBT)  18. Thiuram mix  19. METHYLISOTHIAZOLINONE  20. Propylene glycol  21. 1,3-Diphenylguanidine  22. Hydroperoxides of Limonene  23. Black rubber mix  24. Carba mix  25. Fragrance mix I  26. Fragrance mix II  27. Textile dye mix II  28. Neomycin sulfate  29. Nickel(II) sulfate hexahydrate  30. p-Phenylenediamine (PPD)  31. Potassium dichromate  32. Propolis  33. Sodium Metabisulfite  34. Tixocortol-21-pivalate  35. Lanolin alcohol  36. Methylisothiazolinone + Methylchloroisothiazolinone  37. Cocamidopropyl betaine  38. 3-(Dimethylamino)-1-propylamine  39. Formaldehyde  40. Oleamidopropyl dimethylamine  41. 2-Bromo-2-Nitropropane-l,3-diol  42. Diazolidinyl urea  43. DMDM Hydantoin  44. Epoxy resin, Bisphenol A   45. Benzophenone-4  46. Imidazolidinyl urea  47. Lauryl polyglucose  48 Methyl methacrylate  49. Paraben mix  50. Mercapto mix  51. Caine mix III  52. Mixed dialkyl thiourea  53. Compositae mix II  54. Toluenesulfonamide formaldehyde resin  55. Tea Tree Oil oxidized  56. Ylang-Ylang oil  57. Amidoamine  58. Amerchol L 101  59. Benzocaine  60. Benzyl alchohol  61. Benzyl salicylate  62. Chloroxylenol (PCMX)  63. Cocamide DEA  64. Clobetasol-17-propionate  65. Toluene-2,5-Diamine sulfate  66. Ethyl acrylate  67. N-Isopropyl-N-phenyl--4-phenylenediamine (IPPD)  68. Lidocaine   69. Omitted  70. Sesquiterpene lactone mix  71. 2-n-Octyl-4-isothiazolin-3-one  72. Propyl gallate  73. Polymyxin B sulfate  74. Pramoxine hydrochloride  75. Sodium benzoate  76. Sorbitan oleate  77. Sorbitan sesquioleate  78. Tocopherol  79. BENZALKONIUM CHLORIDE  80. Chlorhexidine digluconate    Allergic contact dermatitis - Instructions provided on care of the patches for the next 48 hours. - Sheila Carroll was instructed to avoid showering for the next 48 hours. - Sheila Carroll will follow up in 48 hours and 96 hours for patch readings.    Call the clinic if this treatment plan is not working well for you  Follow up in 2 days or sooner if needed.  Thank you for the opportunity to care for this patient.  Please do not hesitate to contact me with questions.  Arlean Mutter, FNP [facility]  [facility]

## 2024-06-01 NOTE — Patient Instructions (Signed)
   [address] [place] [address] Dept: [phone]  Diagnostics: NAC 80 patches placed NAC-80 (1-80)   1. Ammonium persulfate  2. [place] Balsam  3. Omitted  4. 4-tert-Butylphenolformaldehyde resin (PTBP)  5. Bacitracin  6. Budesonide   7. Quaternium-15  8. Cinnamal  9. Cobalt(II) chloride hexahydrate  10. Colophonium  11. Methyldibromo glutaronitrile  12. Decyl Glucoside  13. Ethylenediamine dihydrochloride  14. 2-Hydroxyethyl methacrylate  15. Hydroperoxides of Linalool  16. Iodopropynyl butylcarbamate  17. 2-Mercaptobenzothiazole (MBT)  18. Thiuram mix  19. METHYLISOTHIAZOLINONE  20. Propylene glycol  21. 1,3-Diphenylguanidine  22. Hydroperoxides of Limonene  23. Black rubber mix  24. Carba mix  25. Fragrance mix I  26. Fragrance mix II  27. Textile dye mix II  28. Neomycin sulfate  29. Nickel(II) sulfate hexahydrate  30. p-Phenylenediamine (PPD)  31. Potassium dichromate  32. Propolis  33. Sodium Metabisulfite  34. Tixocortol-21-pivalate  35. Lanolin alcohol  36. Methylisothiazolinone + Methylchloroisothiazolinone  37. Cocamidopropyl betaine  38. 3-(Dimethylamino)-1-propylamine  39. Formaldehyde  40. Oleamidopropyl dimethylamine  41. 2-Bromo-2-Nitropropane-l,3-diol  42. Diazolidinyl urea  43. DMDM Hydantoin  44. Epoxy resin, Bisphenol A  45. Benzophenone-4  46. Imidazolidinyl urea  47. Lauryl polyglucose  48 Methyl methacrylate  49. Paraben mix  50. Mercapto mix  51. Caine mix III  52. Mixed dialkyl thiourea  53. Compositae mix II  54. Toluenesulfonamide formaldehyde resin  55. Tea Tree Oil oxidized  56. Ylang-Ylang oil  57. Amidoamine  58. Amerchol L 101  59. Benzocaine  60. Benzyl alchohol  61. Benzyl salicylate  62. Chloroxylenol (PCMX)  63. Cocamide DEA  64. Clobetasol-17-propionate  65. Toluene-2,5-Diamine sulfate  66. Ethyl acrylate  67. N-Isopropyl-N-phenyl--4-phenylenediamine (IPPD)  68. Lidocaine   69. Omitted  70.  Sesquiterpene lactone mix  71. 2-n-Octyl-4-isothiazolin-3-one  72. Propyl gallate  73. Polymyxin B sulfate  74. Pramoxine hydrochloride  75. Sodium benzoate  76. Sorbitan oleate  77. Sorbitan sesquioleate  78. Tocopherol  79. BENZALKONIUM CHLORIDE  80. Chlorhexidine digluconate    Allergic contact dermatitis - Instructions provided on care of the patches for the next 48 hours. - Sheila Carroll was instructed to avoid showering for the next 48 hours. - Sheila Carroll will follow up in 48 hours and 96 hours for patch readings.    Call the clinic if this treatment plan is not working well for you  Follow up in 2 days or sooner if needed.

## 2024-06-02 ENCOUNTER — Ambulatory Visit: Admitting: Family Medicine

## 2024-06-02 ENCOUNTER — Encounter: Payer: Self-pay | Admitting: Family Medicine

## 2024-06-02 DIAGNOSIS — L259 Unspecified contact dermatitis, unspecified cause: Secondary | ICD-10-CM | POA: Insufficient documentation

## 2024-06-02 DIAGNOSIS — L235 Allergic contact dermatitis due to other chemical products: Secondary | ICD-10-CM | POA: Diagnosis not present

## 2024-06-04 ENCOUNTER — Ambulatory Visit (INDEPENDENT_AMBULATORY_CARE_PROVIDER_SITE_OTHER): Admitting: Internal Medicine

## 2024-06-04 ENCOUNTER — Encounter: Payer: Self-pay | Admitting: Internal Medicine

## 2024-06-04 DIAGNOSIS — L235 Allergic contact dermatitis due to other chemical products: Secondary | ICD-10-CM | POA: Diagnosis not present

## 2024-06-04 NOTE — Progress Notes (Signed)
 Follow Up Note  RE: Carlisa Eble MRN: [id number] DOB: 1986-02-12 Date of Office Visit: 06/04/2024  Referring provider: Podraza, Cole Christoph* Primary care provider: Podraza, Cole Christopher, PA-C  History of Present Illness: I had the pleasure of seeing Clarene Curran for a follow up visit at the [facility] on 06/04/2024. She is a 38 y.o. female, who is being followed for suspected contact dermatitis, as well as asthma, allergic rhinitis with conjunctivitis, pollen food allergy syndrome, eczema . Today she is here for initial patch test interpretation, given suspected history of contact dermatitis.   Diagnostics:   NAC-80 TEST 48 hour reading:   NAC-80 - 06/04/24 1107     NAC Panel Tested NAC-80 (1-80)    1. Ammonium persulfate Negative    2. [place] Balsam Negative    3. BENZISOTHIAZOLINONE Negative    4. 4-tert-Butylphenolformaldehyde resin (PTBP) Negative    5. Bacitracin Negative    6. Budesonide  Negative    7. Quaternium-15 Negative    8. Cinnamal Negative    9. Cobalt(II) chloride hexahydrate Negative    10. Colophonium Negative    11. Methyldibromo glutaronitrile Negative    12. Decyl Glucoside Negative    13. Ethylenediamine dihydrochloride Negative    14. 2-Hydroxyethyl methacrylate Negative    15. Hydroperoxides of Linalool 1+    16. Iodopropynyl butylcarbamate Negative    17. 2-Mercaptobenzothiazole (MBT) Negative    18. Thiuram mix Negative    19. METHYLISOTHIAZOLINONE 1+    20. Propylene glycol Negative    21. 1,3-Diphenylguanidine Negative    22. Hydroperoxides of Limonene Negative    23. Black rubber mix +/-    24. Carba mix Negative    25. Fragrance mix I Negative    26. Fragrance mix II Negative    27. Textile dye mix II Negative    28. Neomycin sulfate Negative    29. Nickel(II) sulfate hexahydrate Negative    30. p-Phenylenediamine (PPD) Negative    31. Potassium dichromate Negative    32. Propolis Negative    33. Sodium  Metabisulfite Negative    34. Tixocortol-21-pivalate +/-    36. Methylisothiazolinone + Methylchloroisothiazolinone Negative    37. Cocamidopropyl betaine Negative    38. 3-(Dimethylamino)-1-propylamine +/-    39. Formaldehyde Negative    40. Oleamidopropyl dimethylamine Negative    41. 2-Bromo-2-Nitropropane-l,3-diol Negative    42. Diazolidinyl urea Negative    43. DMDM Hydantoin Negative    44. Epoxy resin, Bisphenol A Negative    45. Benzophenone-4 Negative    46. Imidazolidinyl urea Negative    47. Lauryl polyglucose Negative    48 Methyl methacrylate Negative    49. Paraben mix Negative    50. Mercapto mix Negative    51. Caine mix III Negative    52. Mixed dialkyl thiourea Negative    53. Compositae mix II Negative    54. Toluenesulfonamide formaldehyde resin Negative    55. Tea Tree Oil oxidized Negative    56. Ylang-Ylang oil Negative    57. Amidoamine Negative    58. Amerchol L 101 Negative    59. Benzocaine Negative    60. Benzyl alchohol Negative    61. Benzyl salicylate Negative    62. Chloroxylenol (PCMX) Negative    63. Cocamide DEA Negative    64. Clobetasol-17-propionate Negative    65. Toluene-2,5-Diamine sulfate Negative    66. Ethyl acrylate Negative    67. N-Isopropyl-N-phenyl--4-phenylenediamine (IPPD) Negative    68. Lidocaine  Negative  69. Hydroxyisohexyl 3-Cyclohexene Carboxaldehyde Negative    70. Sesquiterpene lactone mix Negative    71. 2-n-Octyl-4-isothiazolin-3-one Negative    72. Propyl gallate Negative    73. Polymyxin B sulfate Negative    74. Pramoxine hydrochloride Negative    75. Sodium benzoate Negative    76. Sorbitan oleate Negative    77. Sorbitan sesquioleate Negative    78. Tocopherol Negative    79. BENZALKONIUM CHLORIDE Negative    80. Chlorhexidine digluconate Negative           Assessment and Plan: Arvilla is a 38 y.o. female with: Concern for Contact Dermatitis:  Discussed with patient that patch testing tests  for contact dermatitis and sometimes it does not correlate to how one will react to allergens in the body. Positive patch testing results can help in avoiding those items however it is possible to get false negative results.  Nevertheless, this is the most accessible test for contact dermatitis currently available. There are other more extensive patches Okay to take antihistamines for itching but avoid placing any creams on the back where the patches are.  Return in about 2 days (around 06/06/2024).  It was my pleasure to see Fatemah today and participate in her care. Please feel free to contact me with any questions or concerns.  Sincerely,   Rocky Endow, MD [facility]

## 2024-06-06 ENCOUNTER — Encounter: Payer: Self-pay | Admitting: Family

## 2024-06-06 ENCOUNTER — Ambulatory Visit: Admitting: Family

## 2024-06-06 DIAGNOSIS — L235 Allergic contact dermatitis due to other chemical products: Secondary | ICD-10-CM

## 2024-06-06 NOTE — Addendum Note (Signed)
 Addended by: NORA SAM on: 06/06/2024 02:02 PM   Modules accepted: Orders

## 2024-06-06 NOTE — Progress Notes (Signed)
 Follow-up Note  RE: Sheila Carroll FMW:[id number]   DOB: 01-Nov-1985 Date of Office Visit: 06/06/24  Primary care provider: Podraza, Cole Christopher, PA-C Referring provider: Podraza, Cole Christopher, PA-C   Truda returns to the office today for the final patch test interpretation, given suspected history of contact dermatitis.    Diagnostics:    NAC 80 48-hour reading:  NAC Panel Tested NAC-80 (1-80)   1. Ammonium persulfate Negative   2. [place] Balsam Negative   3. BENZISOTHIAZOLINONE Comment   omit  4. 4-tert-Butylphenolformaldehyde resin (PTBP) Negative   5. Bacitracin Negative   6. Budesonide  Negative   7. Quaternium-15 Negative   8. Cinnamal Negative   9. Cobalt(II) chloride hexahydrate Negative  10. Colophonium Negative   11. Methyldibromo glutaronitrile Negative   12. Decyl Glucoside Negative   13. Ethylenediamine dihydrochloride Negative   14. 2-Hydroxyethyl methacrylate Negative   15. Hydroperoxides of Linalool +  16. Iodopropynyl butylcarbamate Negative   17. 2-Mercaptobenzothiazole (MBT) Negative   18. Thiuram mix Negative   19. METHYLISOTHIAZOLINONE +  20. Propylene glycol Negative   21. 1,3-Diphenylguanidine Negative   22. Hydroperoxides of Limonene Negative   23. Black rubber mix Negative   24. Carba mix Negative   25. Fragrance mix I Negative   26. Fragrance mix II Negative   27. Textile dye mix II Negative   28. Neomycin sulfate Negative   29. Nickel(II) sulfate hexahydrate Negative  30. p-Phenylenediamine (PPD) Negative   31. Potassium dichromate Negative   32. Propolis Negative   33. Sodium Metabisulfite Negative   34. Tixocortol-21-pivalate Negative   35. Lanolin alcohol Negative   36. Methylisothiazolinone + Methylchloroisothiazolinone Negative   37. Cocamidopropyl betaine Negative   38. 3-(Dimethylamino)-1-propylamine Negative   39. Formaldehyde Negative  40. Oleamidopropyl dimethylamine Negative   41. 2-Bromo-2-Nitropropane-l,3-diol  Negative   42. Diazolidinyl urea Negative  43. DMDM Hydantoin Negative   44. Epoxy resin, Bisphenol A Negative   45. Benzophenone-4 Negative   46. Imidazolidinyl urea Negative   47. Lauryl polyglucose Negative  48 Methyl methacrylate Negative   49. Paraben mix Negative   50. Mercapto mix Negative   51. Caine mix III Negative   52. Mixed dialkyl thiourea Negative   53. Compositae mix II Negative   54. Toluenesulfonamide formaldehyde resin Negative   55. Tea Tree Oil oxidized Negative   56. Ylang-Ylang oil Negative   57. Amidoamine Negative   58. Amerchol L 101 Negative   59. Benzocaine Negative   60. Benzyl alchohol Negative   61. Benzyl salicylate Negative   62. Chloroxylenol (PCMX) Negative   63. Cocamide DEA Negative   64. Clobetasol-17-propionate Negative   65. Toluene-2,5-Diamine sulfate Negative   66. Ethyl acrylate Negative   67. N-Isopropyl-N-phenyl--4-phenylenediamine (IPPD) Negative   68. Lidocaine  Negative   69. Hydroxyisohexyl 3-Cyclohexene Carboxaldehyde Comment   omit  70. Sesquiterpene lactone mix Negative   71. 2-n-Octyl-4-isothiazolin-3-one Negative   72. Propyl gallate Negative   73. Polymyxin B sulfate Negative   74. Pramoxine hydrochloride Negative   75. Sodium benzoate Negative   76. Sorbitan oleate Negative   77. Sorbitan sesquioleate Negative   78. Tocopherol Negative   79. BENZALKONIUM CHLORIDE Negative   80. Chlorhexidine digluconate Negative     Plan:   Allergic contact dermatitis - The patient has been provided detailed information regarding the substances she is sensitive to, as well as products containing the substances.   - Meticulous avoidance of these substances is recommended.  - If avoidance is not possible, the use of  barrier creams or lotions is recommended. - If symptoms persist or progress despite meticulous avoidance of positive items, a dermatology referral may be warranted. - The sensitivity of patch testing can range from  60-80% and therefore false negative results are possible. Therefore, this does not definitively rule out contact dermatitis.   Thank you for the opportunity to care for this patient.  Please do not hesitate to contact me with questions.  Wanda Craze, FNP [facility]

## 2024-07-03 NOTE — Patient Instructions (Incomplete)
 Asthma: - continue Singulair   - Continue Symbicort  80 mcg 2 puffs twice daily. Use every day. Rinse mouth out after use. -have access to albuterol  inhaler 2 puffs every 4-6 hours as needed for cough/wheeze/shortness of breath/chest tightness.  May use 15-20 minutes prior to activity.   Monitor frequency of use.    Asthma control goals:  Full participation in all desired activities (may need albuterol  before activity) Albuterol  use two time or less a week on average (not counting use with activity) Cough interfering with sleep two time or less a month Oral steroids no more than once a year No hospitalizations  Allergic rhinitis:  -continue avoidance measures for grasses, weeds, trees, molds, cat and cockroach -continue Xyzal 5mg  daily as needed.  this is a long-acting antihistamine similar to Zyrtec that should be more effective  -continue Singulair  10mg  daily at bedtime.   -for itchy/watery eyes use Pataday 1 drop each eye daily as needed -use Flonase  2 sprays each nostril daily for 1-2 weeks at a time before stopping once nasal congestion improves for maximum benefit -use Astelin  2 sprays each nostril twice a day as needed for nasal drainage -consider allergen immunotherapy (allergy shots) if medication management is not effective enough in controlling allergy symptoms.  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 It typically occurs in people who have pollen allergies when the immune system sees proteins on the food that look like proteins on the pollen. This results in the allergy antibody (IgE) binding to the food instead of the pollen. Patients typically report itching and/or mild swelling of the mouth and throat immediately following  ingestion of certain uncooked fruits (including nuts) or raw vegetables. Only a very small number of affected individuals experience systemic allergic reactions, such as anaphylaxis which occurs with true food allergies.    Atopic dermatitis:  -keep skin moisturized after bathing -use Triamcinolone  twice a day as needed for eczema flares on body.  This is a steroid ointment (do not use on face, armpits or genitalia area) -for eczema flares on face/neck, armpit or genital areas can use a non-steroid ointment Eucrisa  2% twice a day as needed  Seborrheic Dermatitis:  - use ketoconazole  shampoo 2% three times per week on scalp and face and ears for 6-8 weeks, can extend to 12 weeks if needed and then use as needed - use a soft bristle tooth brush to loosen any plaques  Contact dermatitis - 96 hour read positive to Hydroperoxides of Linalool and Methylisothiiazolinone  Follow-up in months or sooner if needed

## 2024-07-04 ENCOUNTER — Ambulatory Visit: Admitting: Family

## 2024-09-18 ENCOUNTER — Ambulatory Visit: Admitting: Family
